# Patient Record
Sex: Female | Born: 1980 | Race: Black or African American | Hispanic: No | Marital: Single | Smoking: Current every day smoker
Health system: Southern US, Community
[De-identification: ages and names within clinical notes are randomized; demographics above are authoritative.]

## PROBLEM LIST (undated history)

## (undated) ENCOUNTER — Emergency Department: Discharge status: Absent without leave | Payer: Self-pay

## (undated) DIAGNOSIS — F419 Anxiety disorder, unspecified: Secondary | ICD-10-CM

## (undated) DIAGNOSIS — E282 Polycystic ovarian syndrome: Secondary | ICD-10-CM

## (undated) DIAGNOSIS — I10 Essential (primary) hypertension: Secondary | ICD-10-CM

## (undated) DIAGNOSIS — K219 Gastro-esophageal reflux disease without esophagitis: Secondary | ICD-10-CM

## (undated) DIAGNOSIS — D649 Anemia, unspecified: Secondary | ICD-10-CM

## (undated) HISTORY — PX: WISDOM TOOTH EXTRACTION: SHX21

---

## 2005-03-13 ENCOUNTER — Emergency Department (HOSPITAL_COMMUNITY): Admission: EM | Admit: 2005-03-13 | Discharge: 2005-03-13 | Payer: Self-pay | Admitting: Emergency Medicine

## 2005-07-14 ENCOUNTER — Emergency Department (HOSPITAL_COMMUNITY): Admission: EM | Admit: 2005-07-14 | Discharge: 2005-07-14 | Payer: Self-pay | Admitting: Emergency Medicine

## 2005-07-20 ENCOUNTER — Emergency Department (HOSPITAL_COMMUNITY): Admission: EM | Admit: 2005-07-20 | Discharge: 2005-07-20 | Payer: Self-pay | Admitting: Emergency Medicine

## 2005-10-06 ENCOUNTER — Emergency Department (HOSPITAL_COMMUNITY): Admission: EM | Admit: 2005-10-06 | Discharge: 2005-10-07 | Payer: Self-pay | Admitting: Emergency Medicine

## 2006-11-05 ENCOUNTER — Emergency Department: Payer: Self-pay | Admitting: Unknown Physician Specialty

## 2007-01-14 ENCOUNTER — Emergency Department: Payer: Self-pay | Admitting: Unknown Physician Specialty

## 2007-02-03 ENCOUNTER — Emergency Department: Payer: Self-pay | Admitting: Internal Medicine

## 2007-06-20 ENCOUNTER — Emergency Department: Payer: Self-pay | Admitting: Emergency Medicine

## 2008-05-10 ENCOUNTER — Emergency Department: Payer: Self-pay | Admitting: Emergency Medicine

## 2008-09-10 ENCOUNTER — Emergency Department: Payer: Self-pay | Admitting: Emergency Medicine

## 2008-09-11 ENCOUNTER — Emergency Department: Payer: Self-pay | Admitting: Emergency Medicine

## 2008-09-13 ENCOUNTER — Emergency Department: Payer: Self-pay | Admitting: Emergency Medicine

## 2009-01-06 ENCOUNTER — Emergency Department: Payer: Self-pay | Admitting: Emergency Medicine

## 2009-01-07 ENCOUNTER — Emergency Department: Payer: Self-pay | Admitting: Unknown Physician Specialty

## 2009-01-10 ENCOUNTER — Ambulatory Visit: Payer: Self-pay | Admitting: Unknown Physician Specialty

## 2009-06-30 ENCOUNTER — Encounter: Payer: Self-pay | Admitting: Maternal and Fetal Medicine

## 2009-07-01 ENCOUNTER — Emergency Department: Payer: Self-pay | Admitting: Emergency Medicine

## 2009-07-20 ENCOUNTER — Encounter: Payer: Self-pay | Admitting: Maternal and Fetal Medicine

## 2009-07-22 ENCOUNTER — Inpatient Hospital Stay: Payer: Self-pay | Admitting: Obstetrics and Gynecology

## 2009-09-14 ENCOUNTER — Emergency Department: Payer: Self-pay | Admitting: Emergency Medicine

## 2009-11-06 ENCOUNTER — Observation Stay: Payer: Self-pay | Admitting: Obstetrics and Gynecology

## 2009-11-06 ENCOUNTER — Emergency Department: Payer: Self-pay | Admitting: Emergency Medicine

## 2009-12-17 ENCOUNTER — Observation Stay: Payer: Self-pay

## 2010-01-01 ENCOUNTER — Observation Stay: Payer: Self-pay | Admitting: Obstetrics and Gynecology

## 2010-01-02 ENCOUNTER — Inpatient Hospital Stay: Payer: Self-pay | Admitting: Obstetrics and Gynecology

## 2010-01-15 ENCOUNTER — Emergency Department: Payer: Self-pay | Admitting: Emergency Medicine

## 2012-04-07 ENCOUNTER — Emergency Department: Payer: Self-pay | Admitting: Emergency Medicine

## 2012-04-07 LAB — HCG, QUANTITATIVE, PREGNANCY: Beta Hcg, Quant.: <1 m[IU]/mL — ABNORMAL LOW

## 2012-04-07 LAB — CBC
HGB: 11.4 g/dL — ABNORMAL LOW (ref 12.0–16.0)
MCH: 22.8 pg — ABNORMAL LOW (ref 26.0–34.0)
MCHC: 31.9 g/dL — ABNORMAL LOW (ref 32.0–36.0)
MCV: 71 fL — ABNORMAL LOW (ref 80–100)
RBC: 4.98 10*6/uL (ref 3.80–5.20)

## 2012-11-17 ENCOUNTER — Emergency Department: Payer: Self-pay | Admitting: Emergency Medicine

## 2013-07-28 ENCOUNTER — Emergency Department: Payer: Self-pay | Admitting: Emergency Medicine

## 2013-07-28 LAB — CBC
HCT: 33.3 % — ABNORMAL LOW (ref 35.0–47.0)
MCHC: 33.3 g/dL (ref 32.0–36.0)
MCV: 71 fL — ABNORMAL LOW (ref 80–100)
Platelet: 267 10*3/uL (ref 150–440)
RBC: 4.72 10*6/uL (ref 3.80–5.20)
WBC: 5.5 10*3/uL (ref 3.6–11.0)

## 2013-07-28 LAB — COMPREHENSIVE METABOLIC PANEL
Anion Gap: 6 — ABNORMAL LOW (ref 7–16)
Bilirubin,Total: 0.4 mg/dL (ref 0.2–1.0)
Calcium, Total: 9 mg/dL (ref 8.5–10.1)
Chloride: 108 mmol/L — ABNORMAL HIGH (ref 98–107)
Co2: 24 mmol/L (ref 21–32)
Creatinine: 0.82 mg/dL (ref 0.60–1.30)
EGFR (African American): >60
Osmolality: 277 (ref 275–301)
Potassium: 3.7 mmol/L (ref 3.5–5.1)
SGPT (ALT): 19 U/L (ref 12–78)

## 2013-07-28 LAB — URINALYSIS, COMPLETE
Bacteria: NONE SEEN
Bilirubin,UR: NEGATIVE
Ketone: NEGATIVE
Leukocyte Esterase: NEGATIVE
Protein: NEGATIVE
RBC,UR: 3 /HPF (ref 0–5)
WBC UR: 2 /HPF (ref 0–5)

## 2013-07-28 LAB — CK TOTAL AND CKMB (NOT AT ARMC)
CK, Total: 158 U/L (ref 21–215)
CK-MB: <0.5 ng/mL — ABNORMAL LOW (ref 0.5–3.6)

## 2013-12-28 ENCOUNTER — Emergency Department: Payer: Self-pay | Admitting: Emergency Medicine

## 2013-12-28 LAB — WET PREP, GENITAL

## 2013-12-28 LAB — CBC
HCT: 35.6 % (ref 35.0–47.0)
HGB: 11.5 g/dL — AB (ref 12.0–16.0)
MCH: 23.1 pg — AB (ref 26.0–34.0)
MCHC: 32.3 g/dL (ref 32.0–36.0)
MCV: 72 fL — AB (ref 80–100)
Platelet: 241 10*3/uL (ref 150–440)
RBC: 4.96 10*6/uL (ref 3.80–5.20)
RDW: 16.1 % — ABNORMAL HIGH (ref 11.5–14.5)
WBC: 6.1 10*3/uL (ref 3.6–11.0)

## 2013-12-28 LAB — URINALYSIS, COMPLETE
BACTERIA: NEGATIVE
Bilirubin,UR: NEGATIVE
Blood: NEGATIVE
GLUCOSE, UR: NEGATIVE mg/dL (ref 0–75)
Leukocyte Esterase: NEGATIVE
Nitrite: NEGATIVE
PROTEIN: NEGATIVE
Ph: 5 (ref 4.5–8.0)
RBC, UR: NONE SEEN /HPF (ref 0–5)
SPECIFIC GRAVITY: 1.028 (ref 1.003–1.030)
WBC UR: NONE SEEN /HPF (ref 0–5)

## 2013-12-28 LAB — HCG, QUANTITATIVE, PREGNANCY: BETA HCG, QUANT.: 16155 m[IU]/mL — AB

## 2013-12-28 LAB — GC/CHLAMYDIA PROBE AMP

## 2014-11-17 ENCOUNTER — Emergency Department: Payer: Self-pay | Admitting: Emergency Medicine

## 2014-12-24 ENCOUNTER — Emergency Department: Payer: Self-pay | Admitting: Emergency Medicine

## 2014-12-24 LAB — URINALYSIS, COMPLETE
BACTERIA: NONE SEEN
Bilirubin,UR: NEGATIVE
Blood: NEGATIVE
Glucose,UR: NEGATIVE mg/dL (ref 0–75)
Hyaline Cast: 1
KETONE: NEGATIVE
Nitrite: NEGATIVE
Ph: 5 (ref 4.5–8.0)
Protein: NEGATIVE
RBC,UR: 3 /HPF (ref 0–5)
SPECIFIC GRAVITY: 1.028 (ref 1.003–1.030)
Squamous Epithelial: 32

## 2014-12-26 LAB — URINE CULTURE

## 2015-05-16 ENCOUNTER — Emergency Department (HOSPITAL_BASED_OUTPATIENT_CLINIC_OR_DEPARTMENT_OTHER)
Admission: EM | Admit: 2015-05-16 | Discharge: 2015-05-16 | Disposition: A | Discharge status: Home / Self Care | Payer: Medicaid Other | Attending: Emergency Medicine | Admitting: Emergency Medicine

## 2015-05-16 ENCOUNTER — Encounter (HOSPITAL_BASED_OUTPATIENT_CLINIC_OR_DEPARTMENT_OTHER): Payer: Self-pay | Admitting: *Deleted

## 2015-05-16 DIAGNOSIS — Z720 Tobacco use: Secondary | ICD-10-CM | POA: Diagnosis not present

## 2015-05-16 DIAGNOSIS — Z3202 Encounter for pregnancy test, result negative: Secondary | ICD-10-CM | POA: Insufficient documentation

## 2015-05-16 DIAGNOSIS — M546 Pain in thoracic spine: Secondary | ICD-10-CM | POA: Insufficient documentation

## 2015-05-16 DIAGNOSIS — Z87442 Personal history of urinary calculi: Secondary | ICD-10-CM | POA: Insufficient documentation

## 2015-05-16 LAB — URINALYSIS, ROUTINE W REFLEX MICROSCOPIC
BILIRUBIN URINE: NEGATIVE
Glucose, UA: NEGATIVE mg/dL
HGB URINE DIPSTICK: NEGATIVE
Ketones, ur: NEGATIVE mg/dL
Leukocytes, UA: NEGATIVE
Nitrite: NEGATIVE
PH: 5.5 (ref 5.0–8.0)
Protein, ur: NEGATIVE mg/dL
SPECIFIC GRAVITY, URINE: 1.024 (ref 1.005–1.030)
Urobilinogen, UA: 0.2 mg/dL (ref 0.0–1.0)

## 2015-05-16 LAB — PREGNANCY, URINE: Preg Test, Ur: NEGATIVE

## 2015-05-16 MED ORDER — METHOCARBAMOL 500 MG PO TABS: 500.0000 mg | ORAL_TABLET | Freq: Two times a day (BID) | ORAL | Status: DC | PRN

## 2015-05-16 MED ORDER — NAPROXEN 250 MG PO TABS: 250.0000 mg | ORAL_TABLET | Freq: Two times a day (BID) | ORAL | Status: DC

## 2015-05-16 MED ORDER — ACETAMINOPHEN 325 MG PO TABS: 650.0000 mg | ORAL_TABLET | Freq: Once | ORAL | Status: DC

## 2015-05-16 NOTE — ED Notes (Signed)
Pt amb to room 6 with slow, steady gait in nad. 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 Pain is to her mid upper back area.

## 2015-05-16 NOTE — ED Provider Notes (Signed)
CSN: [id number]     Arrival date & time 05/16/15  1017 History   First MD Initiated Contact with Patient 05/16/15 1042     Chief Complaint  Patient presents with  . Back Pain   Deborah FlakeLatoya Bradford is a 34 y.o. female who is otherwise healthy who presents to emergency department complaining of right-sided midthoracic back pain ongoing for the past 3 days after she lifted a patient at work. Patient is complaining of 8 out of 10 sharp pain to her rhomboid area that is worse with movement. Patient does report she has a history of kidney stones but reports this feels different and is in a different spot. She is taking nothing for treatment today. The patient denies fevers, chills, chest pain, shortness of breath, cough, wheezing, loss of bladder control, loss of bowel control, urinary symptoms, hematuria, abdominal pain, nausea, vomiting, diarrhea, history of cancer, history of IV drug use, changes, numbness, tingling or weakness.  (Consider location/radiation/quality/duration/timing/severity/associated sxs/prior Treatment) HPI  History reviewed. No pertinent past medical history. History reviewed. No pertinent past surgical history. History reviewed. No pertinent family history. History  Substance Use Topics  . Smoking status: Current Every Day Smoker  . Smokeless tobacco: Not on file  . Alcohol Use: Not on file   OB History    No data available     Review of Systems  Constitutional: Negative for fever and chills.  HENT: Negative for congestion and sore throat.   Eyes: Negative for visual disturbance.  Respiratory: Negative for cough, shortness of breath and wheezing.   Cardiovascular: Negative for chest pain and palpitations.  Gastrointestinal: Negative for nausea, vomiting, abdominal pain and diarrhea.  Genitourinary: Negative for dysuria, urgency, frequency, hematuria and difficulty urinating.  Musculoskeletal: Positive for back pain. Negative for gait problem, neck pain and neck stiffness.   Skin: Negative for rash.  Neurological: Negative for syncope, weakness, light-headedness, numbness and headaches.      Allergies  Flagyl  Home Medications   Prior to Admission medications   Medication Sig Start Date End Date Taking? Authorizing Provider  methocarbamol (ROBAXIN) 500 MG tablet Take 1 tablet (500 mg total) by mouth 2 (two) times daily as needed for muscle spasms. 05/16/15   Everlene FarrierWilliam Tommey Barret, PA-C  naproxen (NAPROSYN) 250 MG tablet Take 1 tablet (250 mg total) by mouth 2 (two) times daily with a meal. 05/16/15   Everlene FarrierWilliam Jamai Dolce, PA-C   BP 113/72 mmHg  Pulse 92  Temp(Src) 98.5 F (36.9 C) (Oral)  Resp 18  Ht 5\' 3"  (1.6 m)  Wt 150 lb (68.04 kg)  BMI 26.58 kg/m2  SpO2 99%  LMP 04/24/2015 Physical Exam  Constitutional: She appears well-developed and well-nourished. No distress.  Nontoxic appearing.  HENT:  Head: Normocephalic and atraumatic.  Eyes: Conjunctivae are normal. Pupils are equal, round, and reactive to light. Right eye exhibits no discharge. Left eye exhibits no discharge.  Neck: Neck supple.  Cardiovascular: Normal rate, regular rhythm, normal heart sounds and intact distal pulses.   Pulmonary/Chest: Effort normal and breath sounds normal. No respiratory distress. She has no wheezes. She has no rales.  Abdominal: Soft. Bowel sounds are normal. She exhibits no distension. There is no tenderness. There is no guarding.  Musculoskeletal: Normal range of motion. She exhibits tenderness. She exhibits no edema.  Patient has tenderness to her right mid back over her rhomboid muscles. No midline back tenderness. No neck tenderness. No back edema, deformity, erythema or ecchymosis. Patient has 5 out of 5 strength in her  bilateral lower extremities. The patient is able to ambulate without difficulty or assistance. No lower extremity edema or tenderness.  Lymphadenopathy:    She has no cervical adenopathy.  Neurological: She is alert. She has normal reflexes. She  displays normal reflexes. Coordination normal.  Bilateral patellar DTRs are intact. Patient has good sensation in her bilateral lower extremities.  Skin: Skin is warm and dry. No rash noted. She is not diaphoretic.  Psychiatric: She has a normal mood and affect. Her behavior is normal.  Nursing note and vitals reviewed.   ED Course  Procedures (including critical care time) Labs Review Labs Reviewed  URINALYSIS, ROUTINE W REFLEX MICROSCOPIC (NOT AT [facility]) - Abnormal; Notable for the following:    APPearance CLOUDY (*)    All other components within normal limits  PREGNANCY, URINE    Imaging Review No results found.   EKG Interpretation None      Filed Vitals:   05/16/15 1024  BP: 113/72  Pulse: 92  Temp: 98.5 F (36.9 C)  TempSrc: Oral  Resp: 18  Height:  (1.6 m)  Weight: 150 lb (68.04 kg)  SpO2: 99%     MDM   Meds given in ED:  Medications  acetaminophen (TYLENOL) tablet 650 mg (not administered)    New Prescriptions   METHOCARBAMOL (ROBAXIN) 500 MG TABLET    Take 1 tablet (500 mg total) by mouth 2 (two) times daily as needed for muscle spasms.   NAPROXEN (NAPROSYN) 250 MG TABLET    Take 1 tablet (250 mg total) by mouth 2 (two) times daily with a meal.    Final diagnoses:  Right-sided thoracic back pain   Patient with right thoracic back pain over her rhomboid muscles after lifting a patient at work.  No neurological deficits and normal neuro exam.  Patient can walk without difficulty or assistance.  No loss of bowel or bladder control.  No concern for cauda equina.  No fever, night sweats, weight loss, h/o cancer, IVDU.  RICE protocol and pain medicine indicated and discussed with patient. We'll discharge with prescription for naproxen and Robaxin. I advised caution using Robaxin as it can make her drowsy. I advised the patient to follow-up with their primary care provider this week. I advised the patient to return to the emergency department with new or  worsening symptoms or new concerns. The patient verbalized understanding and agreement with plan.      Everlene Farrier, PA-C 05/16/15 1144  Doug Sou, MD 05/16/15 [phone]

## 2015-05-16 NOTE — Discharge Instructions (Signed)
Back Pain, Adult °Low back pain is very common. About 1 in 5 people have back pain. The cause of low back pain is rarely dangerous. The pain often gets better over time. About half of people with a sudden onset of back pain feel better in just 2 weeks. About 8 in 10 people feel better by 6 weeks.  °CAUSES °Some common causes of back pain include: °· Strain of the muscles or ligaments supporting the spine. °· Wear and tear (degeneration) of the spinal discs. °· Arthritis. °· Direct injury to the back. °DIAGNOSIS °Most of the time, the direct cause of low back pain is not known. However, back pain can be treated effectively even when the exact cause of the pain is unknown. Answering your caregiver's questions about your overall health and symptoms is one of the most accurate ways to make sure the cause of your pain is not dangerous. If your caregiver needs more information, he or she may order lab work or imaging tests (X-rays or MRIs). However, even if imaging tests show changes in your back, this usually does not require surgery. °HOME CARE INSTRUCTIONS °For many people, back pain returns. Since low back pain is rarely dangerous, it is often a condition that people can learn to manage on their own.  °· Remain active. It is stressful on the back to sit or stand in one place. Do not sit, drive, or stand in one place for more than 30 minutes at a time. Take short walks on level surfaces as soon as pain allows. Try to increase the length of time you walk each day. °· Do not stay in bed. Resting more than 1 or 2 days can delay your recovery. °· Do not avoid exercise or work. Your body is made to move. It is not dangerous to be active, even though your back may hurt. Your back will likely heal faster if you return to being active before your pain is gone. °· Pay attention to your body when you  bend and lift. Many people have less discomfort when lifting if they bend their knees, keep the load close to their bodies, and  avoid twisting. Often, the most comfortable positions are those that put less stress on your recovering back. °· Find a comfortable position to sleep. Use a firm mattress and lie on your side with your knees slightly bent. If you lie on your back, put a pillow under your knees. °· Only take over-the-counter or prescription medicines as directed by your caregiver. Over-the-counter medicines to reduce pain and inflammation are often the most helpful. Your caregiver may prescribe muscle relaxant drugs. These medicines help dull your pain so you can more quickly return to your normal activities and healthy exercise. °· Put ice on the injured area. °¨ Put ice in a plastic bag. °¨ Place a towel between your skin and the bag. °¨ Leave the ice on for 15-20 minutes, 03-04 times a day for the first 2 to 3 days. After that, ice and heat may be alternated to reduce pain and spasms. °· Ask your caregiver about trying back exercises and gentle massage. This may be of some benefit. °· Avoid feeling anxious or stressed. Stress increases muscle tension and can worsen back pain. It is important to recognize when you are anxious or stressed and learn ways to manage it. Exercise is a great option. °SEEK MEDICAL CARE IF: °· You have pain that is not relieved with rest or medicine. °· You have pain that does not improve in 1 week. °· You have new symptoms. °· You are generally not feeling well. °SEEK   IMMEDIATE MEDICAL CARE IF:  °· You have pain that radiates from your back into your legs. °· You develop new bowel or bladder control problems. °· You have unusual weakness or numbness in your arms or legs. °· You develop nausea or vomiting. °· You develop abdominal pain. °· You feel faint. °Document Released: 11/05/2005 Document Revised: 05/06/2012 Document Reviewed: 03/09/2014 °[employer]® Patient Information ©2015 [employer]. This information is not intended to replace advice given to you by your health care provider. Make sure you  discuss any questions you have with your health care provider. ° °Back Exercises °Back exercises help treat and prevent back injuries. The goal of back exercises is to increase the strength of your abdominal and back muscles and the flexibility of your back. These exercises should be started when you no longer have back pain. Back exercises include: °· Pelvic Tilt. Lie on your back with your knees bent. Tilt your pelvis until the lower part of your back is against the floor. Hold this position 5 to 10 sec and repeat 5 to 10 times. °· Knee to Chest. Pull first 1 knee up against your chest and hold for 20 to 30 seconds, repeat this with the other knee, and then both knees. This may be done with the other leg straight or bent, whichever feels better. °· Sit-Ups or Curl-Ups. Bend your knees 90 degrees. Start with tilting your pelvis, and do a partial, slow sit-up, lifting your trunk only 30 to 45 degrees off the floor. Take at least 2 to 3 seconds for each sit-up. Do not do sit-ups with your knees out straight. If partial sit-ups are difficult, simply do the above but with only tightening your abdominal muscles and holding it as directed. °· Hip-Lift. Lie on your back with your knees flexed 90 degrees. Push down with your feet and shoulders as you raise your hips a couple inches off the floor; hold for 10 seconds, repeat 5 to 10 times. °· Back arches. Lie on your stomach, propping yourself up on bent elbows. Slowly press on your hands, causing an arch in your low back. Repeat 3 to 5 times. Any initial stiffness and discomfort should lessen with repetition over time. °· Shoulder-Lifts. Lie face down with arms beside your body. Keep hips and torso pressed to floor as you slowly lift your head and shoulders off the floor. °Do not overdo your exercises, especially in the beginning. Exercises may cause you some mild back discomfort which lasts for a few minutes; however, if the pain is more severe, or lasts for more than 15  minutes, do not continue exercises until you see your caregiver. Improvement with exercise therapy for back problems is slow.  °See your caregivers for assistance with developing a proper back exercise program. °Document Released: 12/13/2004 Document Revised: 01/28/2012 Document Reviewed: 09/06/2011 °[employer]® Patient Information ©2015 [employer]. This information is not intended to replace advice given to you by your health care provider. Make sure you discuss any questions you have with your health care provider. ° °

## 2015-07-06 ENCOUNTER — Encounter: Payer: Self-pay | Admitting: Emergency Medicine

## 2015-07-06 ENCOUNTER — Emergency Department
Admission: EM | Admit: 2015-07-06 | Discharge: 2015-07-06 | Disposition: A | Discharge status: Home / Self Care | Payer: Medicaid Other | Attending: Emergency Medicine | Admitting: Emergency Medicine

## 2015-07-06 DIAGNOSIS — K0889 Other specified disorders of teeth and supporting structures: Secondary | ICD-10-CM

## 2015-07-06 DIAGNOSIS — K088 Other specified disorders of teeth and supporting structures: Secondary | ICD-10-CM | POA: Diagnosis not present

## 2015-07-06 DIAGNOSIS — Z720 Tobacco use: Secondary | ICD-10-CM | POA: Diagnosis not present

## 2015-07-06 DIAGNOSIS — K0381 Cracked tooth: Secondary | ICD-10-CM | POA: Diagnosis not present

## 2015-07-06 MED ORDER — IBUPROFEN 800 MG PO TABS: 800.0000 mg | ORAL_TABLET | Freq: Three times a day (TID) | ORAL | Status: DC | PRN

## 2015-07-06 MED ORDER — PENICILLIN V POTASSIUM 500 MG PO TABS: 500.0000 mg | ORAL_TABLET | Freq: Four times a day (QID) | ORAL | Status: DC

## 2015-07-06 NOTE — ED Notes (Signed)
Pt to ed with c/o sore throat, dental pain, congestion, runny nose, cough since Monday.

## 2015-07-06 NOTE — ED Provider Notes (Signed)
[facility] Emergency Department Provider Note  ____________________________________________  Time seen: Approximately 10:23 AM  I have reviewed the triage vital signs and the nursing notes.   HISTORY  Chief Complaint Sore Throat   HPI Deborah Bradford is a 34 y.o. female presents to the ER for complaints of 3-4 days of left upper tooth pain. Patient reports she has a broken wisdom tooth with some overlapping it that intermittently causes her problems but states that the last few days the pain has increased. Denies fever. Reports continues to eat and drink well.  Patient does report that she had a mild cough on Sunday and Monday and was seen at [facility] and diagnosed with bronchitis and given a Z-Pak. Patient reports she is not taking the Z-Pak as the cough has resolved. Denies cough at this time. Denies congestion or sore throat. Reports here for tooth pain.  States pain is currently 6 out of 10 to left upper tooth. Denies other pain.   History reviewed. No pertinent past medical history.  There are no active problems to display for this patient.   History reviewed. No pertinent past surgical history.  Current Outpatient Rx  Name  Route  Sig  Dispense  Refill                          Allergies Flagyl  No family history on file.  Social History Social History  Substance Use Topics  . Smoking status: Current Every Day Smoker  . Smokeless tobacco: None  . Alcohol Use: Yes    Review of Systems Constitutional: No fever/chills Eyes: No visual changes. ENT: No sore throat. Positive for dental pain. Cardiovascular: Denies chest pain. Respiratory: Denies shortness of breath. Gastrointestinal: No abdominal pain.  No nausea, no vomiting.  No diarrhea.  No constipation. Genitourinary: Negative for dysuria. Musculoskeletal: Negative for back pain. Skin: Negative for rash. Neurological: Negative for headaches, focal weakness or  numbness.  10-point ROS otherwise negative.  ____________________________________________   PHYSICAL EXAM:  VITAL SIGNS: ED Triage Vitals  Enc Vitals Group     BP 07/06/15 0950 120/89 mmHg     Pulse Rate 07/06/15 0950 86     Resp 07/06/15 0950 20     Temp 07/06/15 0950 98.6 F (37 C)     Temp Source 07/06/15 0950 Oral     SpO2 07/06/15 0950 99 %     Weight 07/06/15 0950 225 lb (102.059 kg)     Height 07/06/15 0950  (1.575 m)     Head Cir --      Peak Flow --      Pain Score 07/06/15 0936 7     Pain Loc --      Pain Edu? --      Excl. in GC? --     Constitutional: Alert and oriented. Well appearing and in no acute distress. Eyes: Conjunctivae are normal. PERRL. EOMI. Head: Atraumatic.  Ears: no erythema, normal TMs bilaterally.   Nose: No congestion/rhinnorhea.  Mouth/Throat: Mucous membranes are moist.  Oropharynx non-erythematous. LEft upper tooth #16 fractured with gum tissue overlying tooth, mild gum swelling, no palpable or visualized abscess. NO facial swelling.  Neck: No stridor.  No cervical spine tenderness to palpation. Hematological/Lymphatic/Immunilogical: No cervical lymphadenopathy. Cardiovascular: Normal rate, regular rhythm. Grossly normal heart sounds.  Good peripheral circulation. Respiratory: Normal respiratory effort.  No retractions. Lungs CTAB. Gastrointestinal: Soft and nontender. No distention. Normal Bowel sounds.   Musculoskeletal:  No lower or upper extremity tenderness nor edema.  No joint effusions. Bilateral pedal pulses equal and easily palpated.  Neurologic:  Normal speech and language. No gross focal neurologic deficits are appreciated. No gait instability. Skin:  Skin is warm, dry and intact. No rash noted. Psychiatric: Mood and affect are normal. Speech and behavior are normal.  ____________________________________________   LABS (all labs ordered are listed, but only abnormal results are displayed)  Labs Reviewed - No data to  display ____________________________________________   INITIAL IMPRESSION / ASSESSMENT AND PLAN / ED COURSE  Pertinent labs & imaging results that were available during my care of the patient were reviewed by me and considered in my medical decision making (see chart for details).  No acute distress. Very well-appearing patient. Presents the ER for treatment left outer upper dental pain. 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 No palpable or visualized abscess. We'll start patient on Penicillin VK and ibuprofen. Patient follow-up with dentist. Dental clinic information given. Patient verbalized understanding and agreed to plan. Discussed return parameters. ___________________________________________   FINAL CLINICAL IMPRESSION(S) / ED DIAGNOSES  Final diagnoses:  Pain, dental       Renford Dills, NP 07/06/15 1030  Myrna Blazer, MD 07/06/15 2246

## 2015-07-06 NOTE — Discharge Instructions (Signed)
Take medication as prescribed. Drink plenty of water. Eat soft food diet.   Follow-up with dentist as soon as possible. See below for local dental options as needed. Return to the ER for new or worsening concerns.  Dental Pain A tooth ache may be caused by cavities (tooth decay). Cavities expose the nerve of the tooth to air and hot or cold temperatures. It may come from an infection or abscess (also called a boil or furuncle) around your tooth. It is also often caused by dental caries (tooth decay). This causes the pain you are having. DIAGNOSIS  Your caregiver can diagnose this problem by exam. TREATMENT   If caused by an infection, it may be treated with medications which kill germs (antibiotics) and pain medications as prescribed by your caregiver. Take medications as directed.  Only take over-the-counter or prescription medicines for pain, discomfort, or fever as directed by your caregiver.  Whether the tooth ache today is caused by infection or dental disease, you should see your dentist as soon as possible for further care. SEEK MEDICAL CARE IF: The exam and treatment you received today has been provided on an emergency basis only. This is not a substitute for complete medical or dental care. If your problem worsens or new problems (symptoms) appear, and you are unable to meet with your dentist, call or return to this location. SEEK IMMEDIATE MEDICAL CARE IF:   You have a fever.  You develop redness and swelling of your face, jaw, or neck.  You are unable to open your mouth.  You have severe pain uncontrolled by pain medicine. MAKE SURE YOU:   Understand these instructions.  Will watch your condition.  Will get help right away if you are not doing well or get worse. Document Released: 11/05/2005 Document Revised: 01/28/2012 Document Reviewed: 06/23/2008 [facility] Patient Information 2015 [place], [place]. This information is not intended to replace advice given to you by your  health care provider. Make sure you discuss any questions you have with your health care provider.  OPTIONS FOR DENTAL FOLLOW UP CARE  [place] [facility] - Local Safety Net Dental Clinics [email or web address]   [facility] [phone])  Sharl Ma [phone])  [place] [phone])  [facility] [phone])  [facility] [phone]) This clinic caters to the indigent population and is on a lottery system. Location: [employer] [facility], [employer], [address], [place] Clinic Hours: Wednesdays from 6pm - 9pm, patients seen by a lottery system. For dates, call or go to [email or web address] Services: Cleanings, fillings and simple extractions. Payment Options: DENTAL WORK IS FREE OF CHARGE. Bring proof of income or support. Best way to get seen: Arrive at 5:15 pm - this is a lottery, NOT first come/first serve, so arriving earlier will not increase your chances of being seen.     [facility] [phone] Select option 1 for emergencies   Location: [facility], [place], [address] [place] Clinic Hours: No walk-ins accepted - call the day before to schedule an appointment. Check in times are 9:30 am and 1:30 pm. Services: Simple extractions, temporary fillings, pulpectomy/pulp debridement, uncomplicated abscess drainage. Payment Options: PAYMENT IS DUE AT THE TIME OF SERVICE.  Fee is usually $100-200, additional surgical procedures (e.g. abscess drainage) may be extra. Cash, checks, Visa/MasterCard accepted.  Can file Medicaid if patient is covered for dental - patient should call case worker  to check. No discount for [facility] patients. Best way to get seen: MUST call the day before and get onto the schedule. Can usually be seen  the next 1-2 days. No walk-ins accepted.     [facility] [phone]   Location: [facility], [address] [place] Clinic Hours: M, W, Th, F 8am or 1:30pm, Tues 9a or 1:30 - first come/first served. Services: Simple extractions, temporary fillings, uncomplicated abscess drainage.  You do not need to be an [facility] resident. Payment Options: PAYMENT IS DUE AT THE TIME OF SERVICE. Dental insurance, otherwise sliding scale - bring proof of income or support. Depending on income and treatment needed, cost is usually $50-200. Best way to get seen: Arrive early as it is first come/first served.     [facility] [phone]   Location: [address] Clinic Hours: Mon-Thu 8a-5p Services: Most basic dental services including extractions and fillings. Payment Options: PAYMENT IS DUE AT THE TIME OF SERVICE. Sliding scale, up to 50% off - bring proof if income or support. Medicaid with dental option accepted. Best way to get seen: Call to schedule an appointment, can usually be seen within 2 weeks OR they will try to see walk-ins - show up at 8a or 2p (you may have to wait).     [facility] [phone] [place] RESIDENTS ONLY   Location: [facility], [address], [place], [place] [address] Clinic Hours: By appointment only. Monday - Thursday 8am-5pm, Friday 8am-12pm Services: Cleanings, fillings, extractions. Payment Options: PAYMENT IS DUE AT THE TIME OF SERVICE. Cash, Visa or MasterCard. Sliding scale - $30 minimum per service. Best way to get seen: Come in to office, complete packet and make an appointment - need proof of income or support monies for each household member and proof of [facility] residence. Usually takes about a month to get in.     [facility] [phone]   Location: [address]  [facility] Hours: Walk-in Urgent Care Dental Services are offered Monday-Friday mornings only. The numbers of emergencies accepted daily is limited to the number of providers available. Maximum 15 - Mondays, Wednesdays & Thursdays Maximum 10 - Tuesdays & Fridays Services: You do not need to be a [facility] resident to be seen for a dental emergency. Emergencies are defined as pain, swelling, abnormal bleeding, or dental trauma. Walkins will receive x-rays if needed. NOTE: Dental cleaning is not an emergency. Payment Options: PAYMENT IS DUE AT THE TIME OF SERVICE. Minimum co-pay is $40.00 for uninsured patients. Minimum co-pay is $3.00 for Medicaid with dental coverage. Dental Insurance is accepted and must be presented at time of visit. Medicare does not cover dental. Forms of payment: Cash, credit card, checks. Best way to get seen: If not previously registered with the clinic, walk-in dental registration begins at 7:15 am and is on a first come/first serve basis. If previously registered with the clinic, call to make an appointment.     [facility] [phone] [place] RESIDENTS ONLY   Location: [address] [place], [place] Clinic Hours: Mon-Thu 10a-2p Services: Extractions only! Payment Options: FREE (donations accepted) - bring proof of income or support Best way to get seen: Call and schedule an appointment OR come at 8am on the 1st Monday of every month (except for holidays) when it is first come/first served.     [employer] [phone]  Location: [place] Clinic Hours: Friday mornings Services, Payment Options, Best way to get seen: Call for info

## 2016-03-14 ENCOUNTER — Emergency Department
Admission: EM | Admit: 2016-03-14 | Discharge: 2016-03-14 | Disposition: A | Discharge status: Home / Self Care | Payer: Medicaid Other | Attending: Emergency Medicine | Admitting: Emergency Medicine

## 2016-03-14 ENCOUNTER — Emergency Department: Payer: Medicaid Other

## 2016-03-14 ENCOUNTER — Encounter: Payer: Self-pay | Admitting: *Deleted

## 2016-03-14 DIAGNOSIS — F419 Anxiety disorder, unspecified: Secondary | ICD-10-CM | POA: Insufficient documentation

## 2016-03-14 DIAGNOSIS — F17200 Nicotine dependence, unspecified, uncomplicated: Secondary | ICD-10-CM | POA: Diagnosis not present

## 2016-03-14 DIAGNOSIS — R600 Localized edema: Secondary | ICD-10-CM | POA: Diagnosis not present

## 2016-03-14 DIAGNOSIS — R601 Generalized edema: Secondary | ICD-10-CM

## 2016-03-14 DIAGNOSIS — M25471 Effusion, right ankle: Secondary | ICD-10-CM | POA: Diagnosis present

## 2016-03-14 HISTORY — DX: Anxiety disorder, unspecified: F41.9

## 2016-03-14 LAB — CBC WITH DIFFERENTIAL/PLATELET
BASOS PCT: 0 %
Basophils Absolute: 0 10*3/uL (ref 0–0.1)
EOS ABS: 0.5 10*3/uL (ref 0–0.7)
Eosinophils Relative: 7 %
HCT: 34.3 % — ABNORMAL LOW (ref 35.0–47.0)
Hemoglobin: 10.8 g/dL — ABNORMAL LOW (ref 12.0–16.0)
Lymphocytes Relative: 35 %
Lymphs Abs: 2.6 10*3/uL (ref 1.0–3.6)
MCH: 22 pg — ABNORMAL LOW (ref 26.0–34.0)
MCHC: 31.5 g/dL — ABNORMAL LOW (ref 32.0–36.0)
MCV: 69.8 fL — ABNORMAL LOW (ref 80.0–100.0)
MONO ABS: 0.7 10*3/uL (ref 0.2–0.9)
MONOS PCT: 9 %
Neutro Abs: 3.6 10*3/uL (ref 1.4–6.5)
Neutrophils Relative %: 49 %
Platelets: 274 10*3/uL (ref 150–440)
RBC: 4.92 MIL/uL (ref 3.80–5.20)
RDW: 15.5 % — AB (ref 11.5–14.5)
WBC: 7.5 10*3/uL (ref 3.6–11.0)

## 2016-03-14 LAB — POCT PREGNANCY, URINE: PREG TEST UR: NEGATIVE

## 2016-03-14 LAB — BASIC METABOLIC PANEL
Anion gap: 6 (ref 5–15)
BUN: 14 mg/dL (ref 6–20)
CALCIUM: 8.9 mg/dL (ref 8.9–10.3)
CO2: 24 mmol/L (ref 22–32)
Chloride: 108 mmol/L (ref 101–111)
Creatinine, Ser: 0.77 mg/dL (ref 0.44–1.00)
GFR calc non Af Amer: >60 mL/min (ref >60)
Glucose, Bld: 98 mg/dL (ref 65–99)
Potassium: 3.5 mmol/L (ref 3.5–5.1)
SODIUM: 138 mmol/L (ref 135–145)

## 2016-03-14 NOTE — Discharge Instructions (Signed)
Edema °Edema is an abnormal buildup of fluids in your body tissues. Edema is somewhat dependent on gravity to pull the fluid to the lowest place in your body. That makes the condition more common in the legs and thighs (lower extremities). Painless swelling of the feet and ankles is common and becomes more likely as you get older. It is also common in looser tissues, like around your eyes.  °When the affected area is squeezed, the fluid may move out of that spot and leave a dent for a few moments. This dent is called pitting.  °CAUSES  °There are many possible causes of edema. Eating too much salt and being on your feet or sitting for a long time can cause edema in your legs and ankles. Hot weather may make edema worse. Common medical causes of edema include: °· Heart failure. °· Liver disease. °· Kidney disease. °· Weak blood vessels in your legs. °· Cancer. °· An injury. °· Pregnancy. °· Some medications. °· Obesity.  °SYMPTOMS  °Edema is usually painless. Your skin may look swollen or shiny.  °DIAGNOSIS  °Your health care provider may be able to diagnose edema by asking about your medical history and doing a physical exam. You may need to have tests such as X-rays, an electrocardiogram, or blood tests to check for medical conditions that may cause edema.  °TREATMENT  °Edema treatment depends on the cause. If you have heart, liver, or kidney disease, you need the treatment appropriate for these conditions. General treatment may include: °· Elevation of the affected body part above the level of your heart. °· Compression of the affected body part. Pressure from elastic bandages or support stockings squeezes the tissues and forces fluid back into the blood vessels. This keeps fluid from entering the tissues. °· Restriction of fluid and salt intake. °· Use of a water pill (diuretic). These medications are appropriate only for some types of edema. They pull fluid out of your body and make you urinate more often. This  gets rid of fluid and reduces swelling, but diuretics can have side effects. Only use diuretics as directed by your health care provider. °HOME CARE INSTRUCTIONS  °· Keep the affected body part above the level of your heart when you are lying down.   °· Do not sit still or stand for prolonged periods.   °· Do not put anything directly under your knees when lying down. °· Do not wear constricting clothing or garters on your upper legs.   °· Exercise your legs to work the fluid back into your blood vessels. This may help the swelling go down.   °· Wear elastic bandages or support stockings to reduce ankle swelling as directed by your health care provider.   °· Eat a low-salt diet to reduce fluid if your health care provider recommends it.   °· Only take medicines as directed by your health care provider.  °SEEK MEDICAL CARE IF:  °· Your edema is not responding to treatment. °· You have heart, liver, or kidney disease and notice symptoms of edema. °· You have edema in your legs that does not improve after elevating them.   °· You have sudden and unexplained weight gain. °SEEK IMMEDIATE MEDICAL CARE IF:  °· You develop shortness of breath or chest pain.   °· You cannot breathe when you lie down. °· You develop pain, redness, or warmth in the swollen areas.   °· You have heart, liver, or kidney disease and suddenly get edema. °· You have a fever and your symptoms suddenly get worse. °MAKE SURE YOU:  °·   Understand these instructions. °· Will watch your condition. °· Will get help right away if you are not doing well or get worse. °  °This information is not intended to replace advice given to you by your health care provider. Make sure you discuss any questions you have with your health care provider. °  °Document Released: 11/05/2005 Document Revised: 11/26/2014 Document Reviewed: 08/28/2013 °Elsevier Interactive Patient Education ©2016 [employer] ° °

## 2016-03-14 NOTE — ED Provider Notes (Signed)
[facility] Emergency Department Provider Note        Time seen: ----------------------------------------- 6:42 PM on 03/14/2016 -----------------------------------------    I have reviewed the triage vital signs and the nursing notes.   HISTORY  Chief Complaint Leg Swelling    HPI Deborah Bradford is a 35 y.o. female who presents ER for right ankle swelling and pain behind her right calf. Patient reports she does have anxiety, did take a Valium today. She denies fevers chills or other complaints. She states she sits a lot at work.   Past Medical History  Diagnosis Date  . Anxiety     There are no active problems to display for this patient.   History reviewed. No pertinent past surgical history.  Allergies Flagyl  Social History Social History  Substance Use Topics  . Smoking status: Current Every Day Smoker  . Smokeless tobacco: None  . Alcohol Use: Yes    Review of Systems Constitutional: Negative for fever. Eyes: Negative for visual changes. ENT: Negative for sore throat. Cardiovascular: Negative for chest pain. Respiratory: Negative for shortness of breath. Gastrointestinal: Negative for abdominal pain, vomiting and diarrhea. Genitourinary: Negative for dysuria. Musculoskeletal: Positive for right leg swelling Skin: Negative for rash. Neurological: Negative for headaches, focal weakness or numbness.  10-point ROS otherwise negative.  ____________________________________________   PHYSICAL EXAM:  VITAL SIGNS: ED Triage Vitals  Enc Vitals Group     BP 03/14/16 1828 114/80 mmHg     Pulse Rate 03/14/16 1828 96     Resp 03/14/16 1828 20     Temp 03/14/16 1828 98.6 F (37 C)     Temp Source 03/14/16 1828 Oral     SpO2 03/14/16 1828 96 %     Weight 03/14/16 1828 240 lb (108.863 kg)     Height 03/14/16 1828  (1.6 m)     Head Cir --      Peak Flow --      Pain Score 03/14/16 1828 2     Pain Loc --      Pain Edu? --       Excl. in GC? --     Constitutional: Alert and oriented. Well appearing and in no distress. ENT   Head: Normocephalic and atraumatic.   Nose: No congestion/rhinnorhea.   Mouth/Throat: Mucous membranes are moist.   Neck: No stridor. Cardiovascular: Normal rate, regular rhythm. No murmurs, rubs, or gallops. Respiratory: Normal respiratory effort without tachypnea nor retractions. Breath sounds are clear and equal bilaterally. No wheezes/rales/rhonchi. Musculoskeletal: Mild edema is noted to the right lower extremity, normal pulses Neurologic:  Normal speech and language. No gross focal neurologic deficits are appreciated.  Skin:  Skin is warm, dry and intact. No rash noted. Psychiatric: Mood and affect are normal. Speech and behavior are normal.  ____________________________________________  ED COURSE:  Pertinent labs & imaging results that were available during my care of the patient were reviewed by me and considered in my medical decision making (see chart for details). Patient is in no acute distress, I am doubtful that she has a DVT. ____________________________________________    LABS (pertinent positives/negatives)  Labs Reviewed  CBC WITH DIFFERENTIAL/PLATELET  BASIC METABOLIC PANEL  POC URINE PREG, ED  POCT PREGNANCY, URINE    RADIOLOGY  Ultrasound of the  right lower extremity Is negative for DVT ____________________________________________  FINAL ASSESSMENT AND PLAN  Edema  Plan: Patient with labs and imaging as dictated above. Patient presented for edema in her right leg. Labs  and ultrasound are unremarkable. She is stable for outpatient follow-up with her doctor.   Emily FilbertWilliams, Jacklin Zwick E, MD   Note: This dictation was prepared with Dragon dictation. Any transcriptional errors that result from this process are unintentional   Emily FilbertJonathan E Jasun Gasparini, MD 03/14/16 2006

## 2016-03-14 NOTE — ED Notes (Addendum)
Pt reports swelling of right ankle, negative homans sign, pt reports having anxiety, minimal swelling noted to right calf

## 2016-07-05 IMAGING — US US EXTREM LOW VENOUS*R*
1 series · 14 of 24 positions shown · non-contrast
Comparison: None.

CLINICAL DATA: Right leg swelling for 1 day.

EXAM:
RIGHT LOWER EXTREMITY VENOUS DOPPLER ULTRASOUND
TECHNIQUE: Gray-scale sonography with graded compression, as well as color
Doppler and duplex ultrasound, were performed to evaluate the deep
venous system from the level of the common femoral vein through the
popliteal and proximal calf veins. Spectral Doppler was utilized to
evaluate flow at rest and with distal augmentation maneuvers.

[Series 1: us extrem low venous*right* · 0.08mm/px · 14 of 31 slices shown]
[im 1/31]
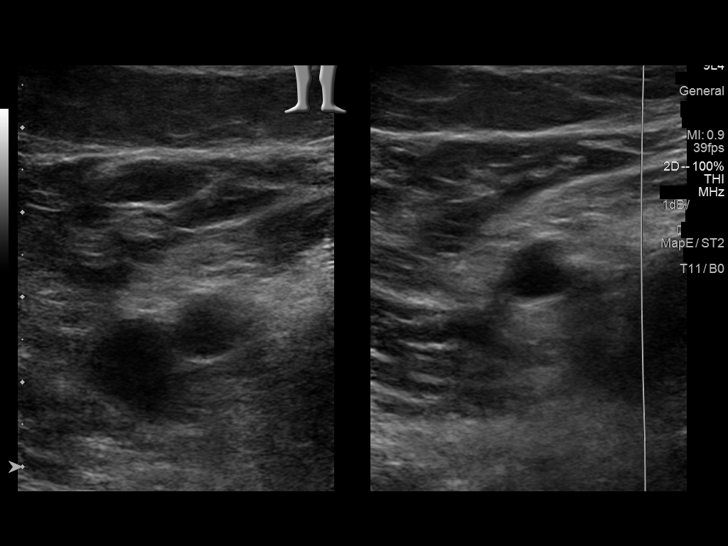
[im 3/31]
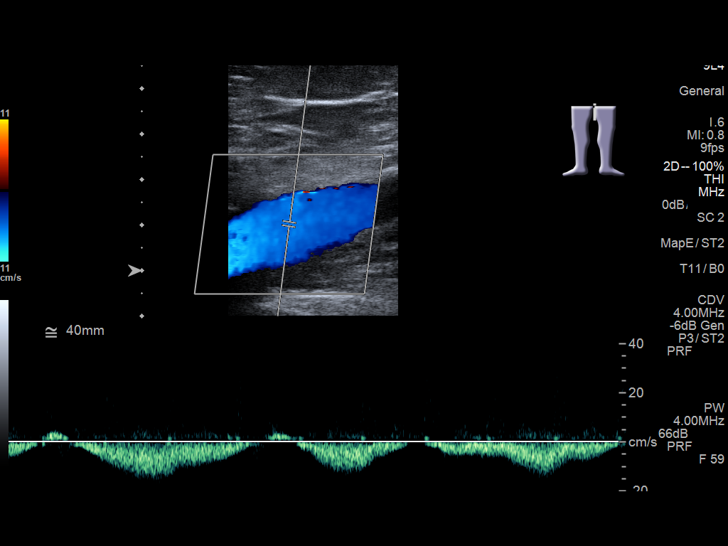
[im 6/31]
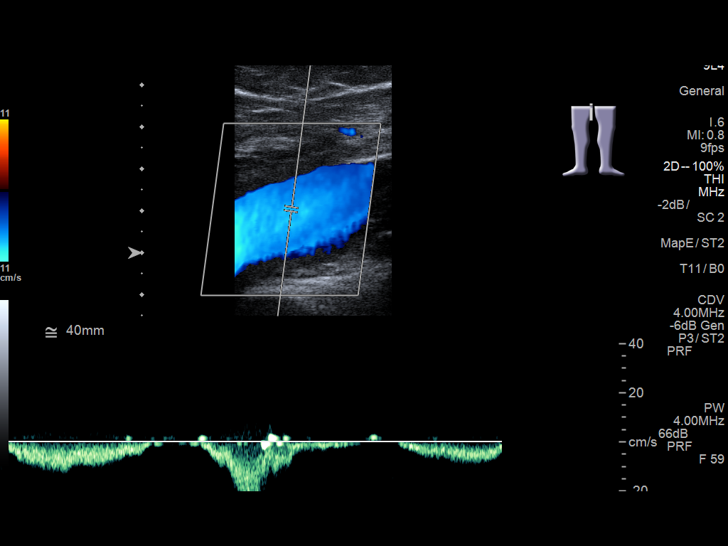
[im 8/31]
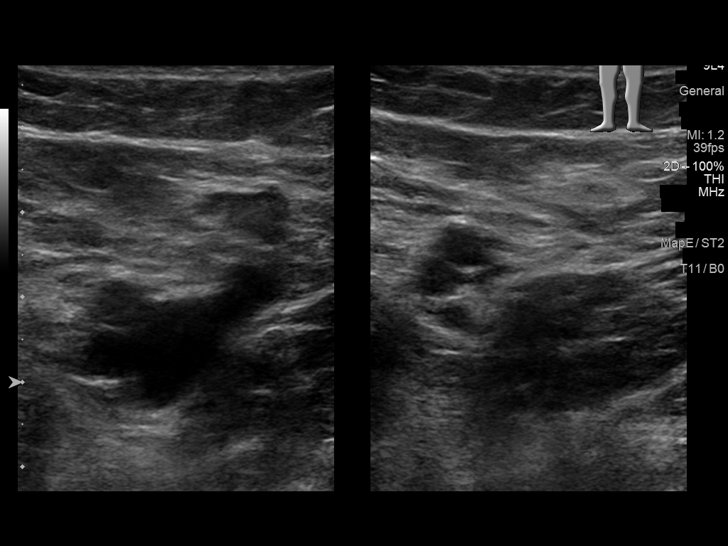
[im 10/31]
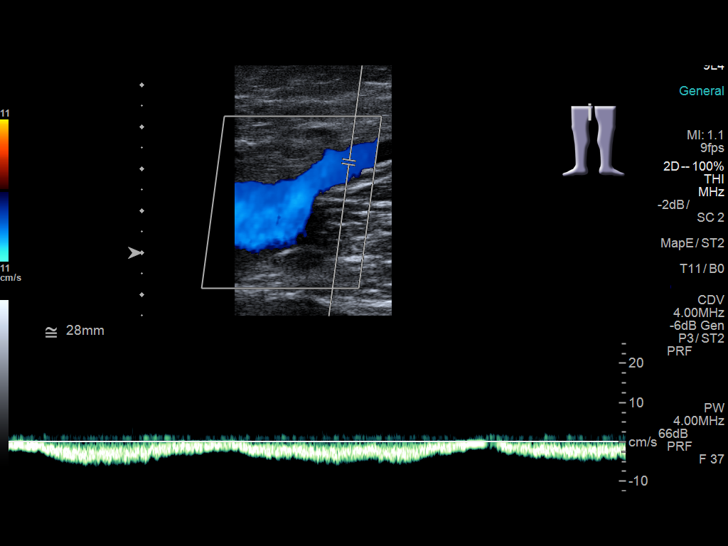
[im 12/31]
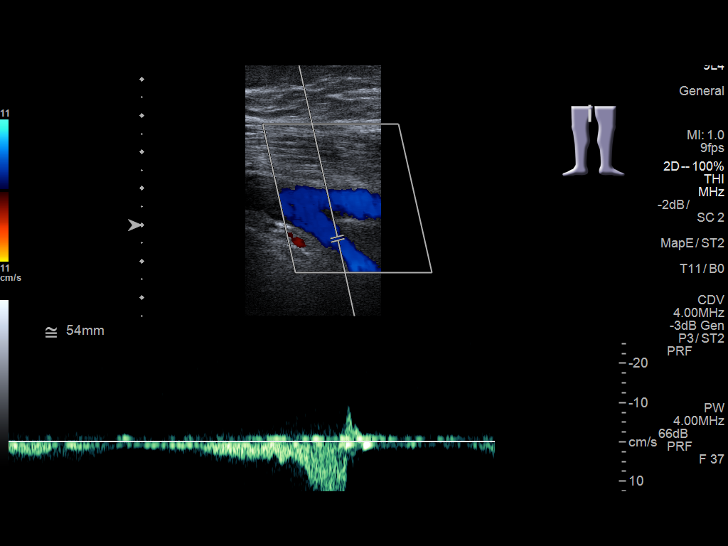
[im 15/31]
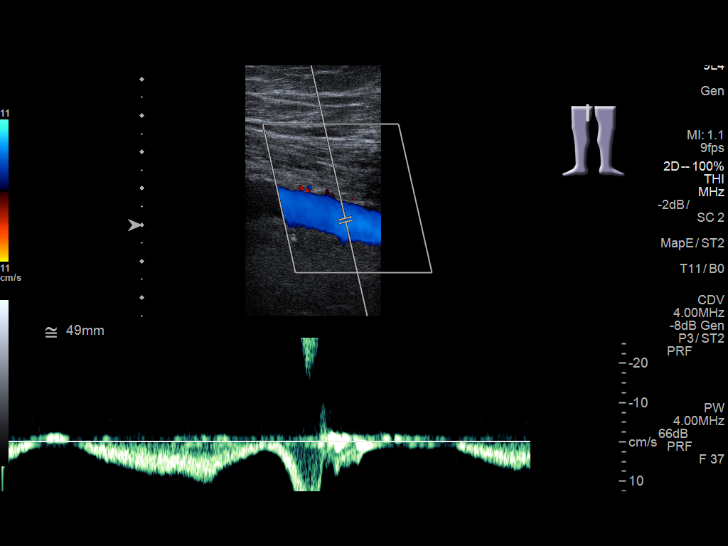
[im 16/31]
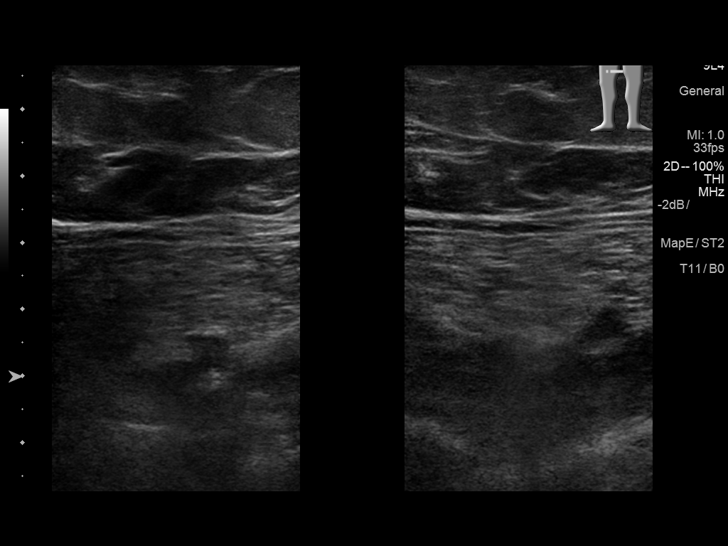
[im 19/31]
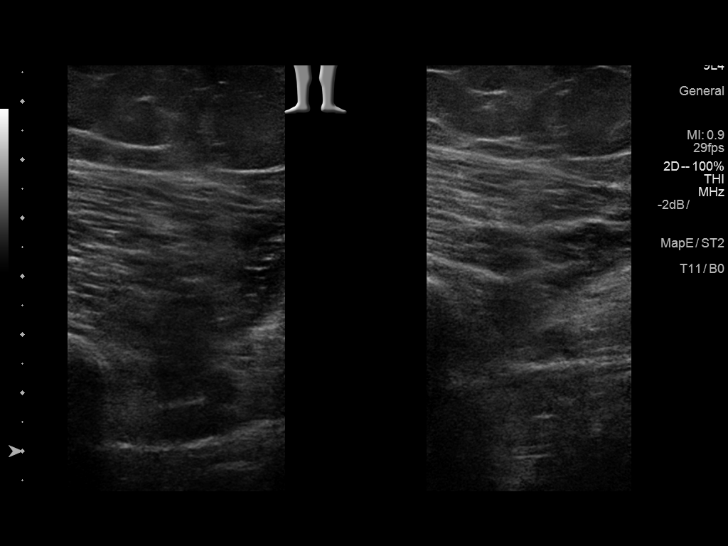
[im 21/31]
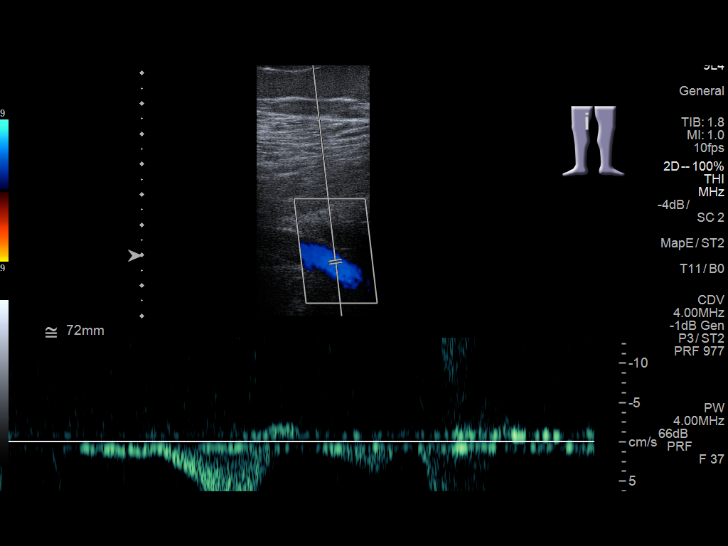
[im 24/31]
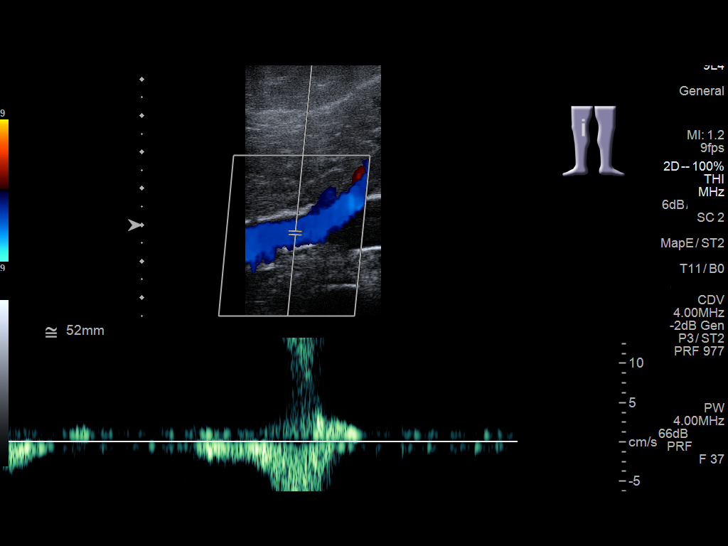
[im 25/31]
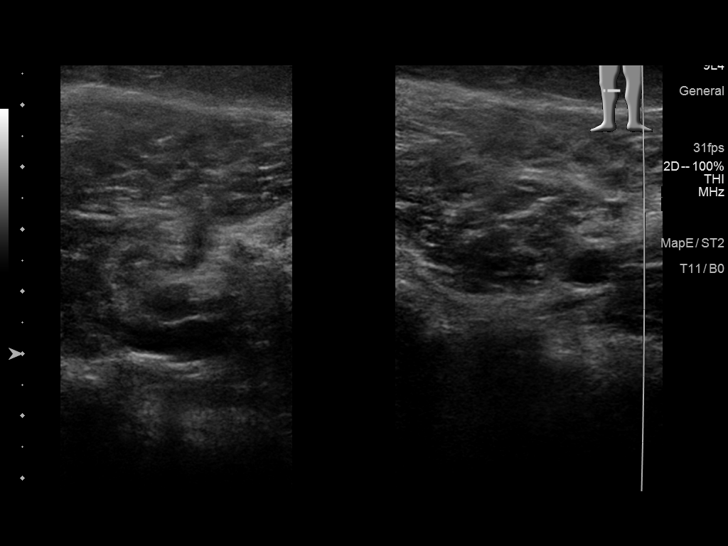
[im 28/31]
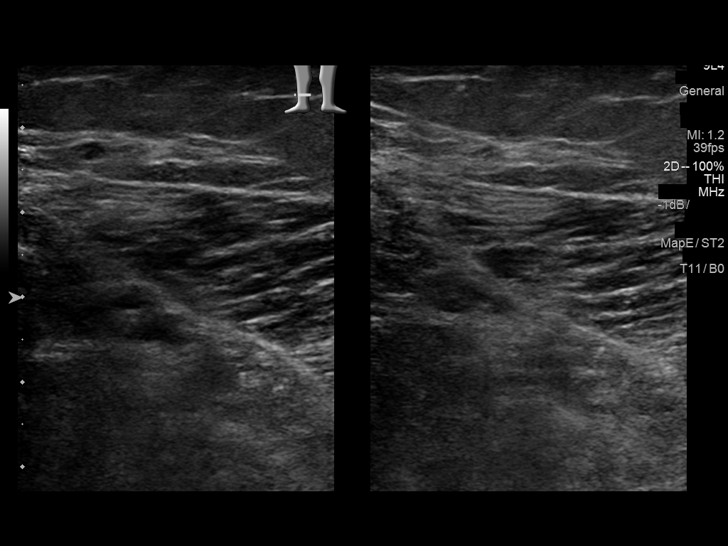
[im 31/31]
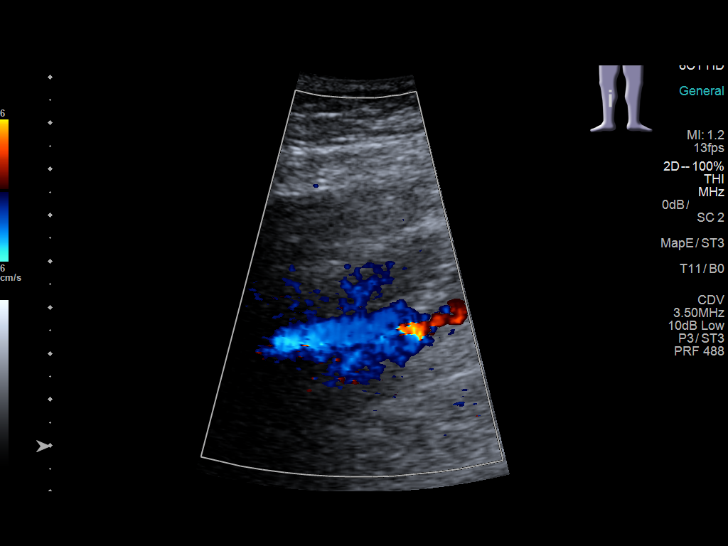

[14 of 24 positions shown; findings below may reference images not displayed]

FINDINGS: Left common femoral vein is patent without thrombus.

Normal compressibility, augmentation and color Doppler flow in the
right common femoral vein, right femoral vein and right popliteal
vein. The right saphenofemoral junction is patent. Right profunda
femoral vein is patent without thrombus. Visualized right deep calf
veins are patent without thrombus.
IMPRESSION: Negative for deep venous thrombosis in right lower extremity.

## 2017-12-09 ENCOUNTER — Emergency Department
Admission: EM | Admit: 2017-12-09 | Discharge: 2017-12-09 | Disposition: A | Discharge status: Home / Self Care | Payer: Medicaid Other | Attending: Emergency Medicine | Admitting: Emergency Medicine

## 2017-12-09 ENCOUNTER — Other Ambulatory Visit: Payer: Self-pay

## 2017-12-09 ENCOUNTER — Emergency Department: Payer: Medicaid Other

## 2017-12-09 ENCOUNTER — Encounter: Payer: Self-pay | Admitting: Emergency Medicine

## 2017-12-09 DIAGNOSIS — E876 Hypokalemia: Secondary | ICD-10-CM | POA: Diagnosis not present

## 2017-12-09 DIAGNOSIS — R079 Chest pain, unspecified: Secondary | ICD-10-CM | POA: Diagnosis present

## 2017-12-09 DIAGNOSIS — F17210 Nicotine dependence, cigarettes, uncomplicated: Secondary | ICD-10-CM | POA: Diagnosis not present

## 2017-12-09 DIAGNOSIS — Z79899 Other long term (current) drug therapy: Secondary | ICD-10-CM | POA: Insufficient documentation

## 2017-12-09 LAB — COMPREHENSIVE METABOLIC PANEL
ALT: 20 U/L (ref 14–54)
AST: 24 U/L (ref 15–41)
Albumin: 3.7 g/dL (ref 3.5–5.0)
Alkaline Phosphatase: 81 U/L (ref 38–126)
Anion gap: 10 (ref 5–15)
BILIRUBIN TOTAL: 0.6 mg/dL (ref 0.3–1.2)
BUN: 18 mg/dL (ref 6–20)
CO2: 23 mmol/L (ref 22–32)
Calcium: 8.9 mg/dL (ref 8.9–10.3)
Chloride: 103 mmol/L (ref 101–111)
Creatinine, Ser: 0.82 mg/dL (ref 0.44–1.00)
GFR calc Af Amer: >60 mL/min (ref >60)
GFR calc non Af Amer: >60 mL/min (ref >60)
Glucose, Bld: 116 mg/dL — ABNORMAL HIGH (ref 65–99)
Potassium: 3.2 mmol/L — ABNORMAL LOW (ref 3.5–5.1)
Sodium: 136 mmol/L (ref 135–145)
TOTAL PROTEIN: 7.5 g/dL (ref 6.5–8.1)

## 2017-12-09 LAB — CBC
HEMATOCRIT: 35.6 % (ref 35.0–47.0)
Hemoglobin: 11.6 g/dL — ABNORMAL LOW (ref 12.0–16.0)
MCH: 23.3 pg — ABNORMAL LOW (ref 26.0–34.0)
MCHC: 32.5 g/dL (ref 32.0–36.0)
MCV: 71.6 fL — AB (ref 80.0–100.0)
PLATELETS: 305 10*3/uL (ref 150–440)
RBC: 4.97 MIL/uL (ref 3.80–5.20)
RDW: 15.7 % — ABNORMAL HIGH (ref 11.5–14.5)
WBC: 5.2 10*3/uL (ref 3.6–11.0)

## 2017-12-09 LAB — TROPONIN I: Troponin I: <0.03 ng/mL (ref <0.03)

## 2017-12-09 MED ORDER — POTASSIUM CHLORIDE CRYS ER 20 MEQ PO TBCR: 40.0000 meq | EXTENDED_RELEASE_TABLET | Freq: Once | ORAL | Status: DC

## 2017-12-09 NOTE — ED Triage Notes (Signed)
FIRST NURSE NOTE -here for CP. Pulled next for EKG. NAD. ambulatory

## 2017-12-09 NOTE — ED Triage Notes (Signed)
States began chest pain 30 min prior to arrival. States feels like brick sitting on her chest. States feels SOB with this. Denies cardiac history.

## 2017-12-09 NOTE — Discharge Instructions (Signed)
You are evaluated for chest discomfort, and although no certain cause was found, your exam and evaluation are reassuring in the emergency department today.  Return to the emergency department immediately for any worsening condition including new or worsening or uncontrolled chest pain, nausea, sweats, dizziness or passing out, weakness, numbness, trouble breathing, fevers, or any other symptoms concerning to you.  Please return to emergency department immediately for any worsening depression or thoughts of wanting to hurt herself or others.

## 2017-12-09 NOTE — ED Notes (Addendum)
Pt in hall telling edp that she was ready to go. epd asked her to wait until d/c papers were printed but pt refused. Pt was asked to wait for med to be given and vs to be taken. She refused saying that she could take her temp at home.   Pt had been made aware by me earlier that I would be drawing a troponin at 1400 and at that time pt seemed ok with the delay.  Pt refused to sign for d/c

## 2017-12-09 NOTE — ED Notes (Signed)
Pt a/o. Able to ambulate from lobby to room.

## 2017-12-09 NOTE — ED Provider Notes (Signed)
[facility] Emergency Department Provider Note ____________________________________________   I have reviewed the triage vital signs and the triage nursing note.  HISTORY  Chief Complaint Chest Pain   Historian Patient  HPI Deborah Bradford is a 37 y.o. female presenting for complaint of chest pain.  States it started this morning but she is not quite sure exactly what time.  She states she has not slept much since Friday.  She is having a social stressor going on with regard to false accusation at work which is extremely stressful for her.  She is tearful, but states that she is not having any suicidal homicidal thoughts.  She is treated for anxiety with Valium by her primary care doctor.  She states symptoms previously of chest pain have been thought to be due to anxiety versus GERD.  Today felt a little stronger and felt like pressure which is why she decided to come and get checked out.  No trouble breathing or pleuritic chest pain.  No coughing or fevers.   Past Medical History:  Diagnosis Date  . Anxiety     There are no active problems to display for this patient.   History reviewed. No pertinent surgical history.  Prior to Admission medications   Medication Sig Start Date End Date Taking? Authorizing Provider  ibuprofen (ADVIL,MOTRIN) 800 MG tablet Take 1 tablet (800 mg total) by mouth every 8 (eight) hours as needed for mild pain or moderate pain. 07/06/15   Renford Dills, NP  methocarbamol (ROBAXIN) 500 MG tablet Take 1 tablet (500 mg total) by mouth 2 (two) times daily as needed for muscle spasms. 05/16/15   Everlene Farrier, PA-C  naproxen (NAPROSYN) 250 MG tablet Take 1 tablet (250 mg total) by mouth 2 (two) times daily with a meal. 05/16/15   Everlene Farrier, PA-C  penicillin v potassium (VEETID) 500 MG tablet Take 1 tablet (500 mg total) by mouth 4 (four) times daily. 07/06/15   Renford Dills, NP    Allergies  Allergen Reactions  .  Flagyl [Metronidazole]     No family history on file.  Social History Social History   Tobacco Use  . Smoking status: Current Every Day Smoker    Packs/day: 0.50    Types: Cigarettes  Substance Use Topics  . Alcohol use: Yes  . Drug use: No    Review of Systems  Constitutional: Negative for fever. Eyes: Negative for visual changes. ENT: Negative for sore throat. Cardiovascular: Positive for chest pressure/pain as per HPI.   Respiratory: Negative for shortness of breath. Gastrointestinal: Negative for abdominal pain, vomiting and diarrhea. Genitourinary: Negative for dysuria. Musculoskeletal: Negative for back pain. Skin: Negative for rash. Neurological: Negative for headache.  ____________________________________________   PHYSICAL EXAM:  VITAL SIGNS: ED Triage Vitals [12/09/17 1059]  Enc Vitals Group     BP      Pulse      Resp      Temp      Temp src      SpO2      Weight 237 lb (107.5 kg)     Height 5\' 2"  (1.575 m)     Head Circumference      Peak Flow      Pain Score 5     Pain Loc      Pain Edu?      Excl. in GC?      Constitutional: Alert and oriented. Well appearing and in no distress. HEENT   Head: Normocephalic and atraumatic.  Eyes: Conjunctivae are normal. Pupils equal and round.       Ears:         Nose: No congestion/rhinnorhea.   Mouth/Throat: Mucous membranes are moist.   Neck: No stridor. Cardiovascular/Chest: Normal rate, regular rhythm.  No murmurs, rubs, or gallops. Respiratory: Normal respiratory effort without tachypnea nor retractions. Breath sounds are clear and equal bilaterally. No wheezes/rales/rhonchi. Gastrointestinal: Soft. No distention, no guarding, no rebound. Nontender.    Genitourinary/rectal:Deferred Musculoskeletal: Nontender with normal range of motion in all extremities. No joint effusions.  No lower extremity tenderness.  No edema. Neurologic:  Normal speech and language. No gross or focal  neurologic deficits are appreciated. Skin:  Skin is warm, dry and intact. No rash noted. Psychiatric: Mood and affect are normal. Speech and behavior are normal. Patient exhibits appropriate insight and judgment.   ____________________________________________  LABS (pertinent positives/negatives) I, Cheral Cappucci, MD the attenGovernor Rooksding physician have reviewed the labs noted below.  Labs Reviewed  CBC - Abnormal; Notable for the following components:      Result Value   Hemoglobin 11.6 (*)    MCV 71.6 (*)    MCH 23.3 (*)    RDW 15.7 (*)    All other components within normal limits  COMPREHENSIVE METABOLIC PANEL - Abnormal; Notable for the following components:   Potassium 3.2 (*)    Glucose, Bld 116 (*)    All other components within normal limits  TROPONIN I  TROPONIN I    ____________________________________________    EKG I, Governor Rooksebecca Danayah Smyre, MD, the attending physician have personally viewed and interpreted all ECGs.  78 bpm.  Normal sinus rhythm.  Narrow QRS.  Normal axis.  Nonspecific ST and T wave ____________________________________________  RADIOLOGY All Xrays were viewed by me.  Imaging interpreted by Radiologist, and I, Governor Rooksebecca Pansy Ostrovsky, MD the attending physician have reviewed the radiologist interpretation noted below.  Chest x-ray two-view:  IMPRESSION: No active cardiopulmonary disease. __________________________________________  PROCEDURES  Procedure(s) performed: None  Critical Care performed: None   ____________________________________________  ED COURSE / ASSESSMENT AND PLAN  Pertinent labs & imaging results that were available during my care of the patient were reviewed by me and considered in my medical decision making (see chart for details).    Description of chest discomfort is nonspecific and EKG is reassuring.  Initial laboratory studies are reassuring.  She has a mild hypokalemia.  Initial troponin negative.  I am going to repeat a 3-hour  troponin level for 2 PM.  Chest x-ray was added on is reassuring and normal in appearance.  Patient broke down in tears discussing the social situation that she is currently under which is distressing.  She does not meet any criteria for emergency psychiatric evaluation/involuntary commitment at this point in time, and she has a good relationship with her primary care doctor.  I have asked her to get in touch with her primary care doctor with regard to any ongoing anxiety symptoms and likely for referral to psychiatry/counselor to deal with the social stressors here recently.  On reevaluation, we discussed reassuring chest x-ray.  We discussed mildly low potassium.  Patient was quite frustrated and stated that she was leaving.  She states she is not having discomfort anymore and was going to go home.  She was frustrated that she did not have a set of vital signs.  I am also frustrated by this as well.  I asked her to allow me to do a set of vital signs right now.  She was pulled back immediately prior to triage into a room, and apparently did not get a set of vital signs as I saw her immediately.  She refused and did not want to do vital signs now.  Patient stated that she did not want to stay for repeat troponin or potassium repletion.  We discussed the risk of missed heart attack and death.  She is alert and oriented and capable of making her own decisions.  She did go ahead and walk out without discharge instructions.    DIFFERENTIAL DIAGNOSIS: Differential diagnosis includes, but is not limited to, ACS, aortic dissection, pulmonary embolism, cardiac tamponade, pneumothorax, pneumonia, pericarditis, myocarditis, GI-related causes including esophagitis/gastritis, and musculoskeletal chest wall pain.    CONSULTATIONS:   None   Patient / Family / Caregiver informed of clinical course, medical decision-making process, and agree with plan.   I discussed return precautions, follow-up instructions,  and discharge instructions with patient and/or family.  Discharge Instructions prepared but not given: ---  You are evaluated for chest discomfort, and although no certain cause was found, your exam and evaluation are reassuring in the emergency department today.  Return to the emergency department immediately for any worsening condition including new or worsening or uncontrolled chest pain, nausea, sweats, dizziness or passing out, weakness, numbness, trouble breathing, fevers, or any other symptoms concerning to you.  Please return to emergency department immediately for any worsening depression or thoughts of wanting to hurt herself or others.    ___________________________________________   FINAL CLINICAL IMPRESSION(S) / ED DIAGNOSES   Final diagnoses:  Hypokalemia  Nonspecific chest pain      ___________________________________________        Note: This dictation was prepared with Dragon dictation. Any transcriptional errors that result from this process are unintentional    Governor Rooks, MD 12/09/17 1323

## 2017-12-09 NOTE — ED Notes (Signed)
Pt in c pod. Not on monitor at this time.

## 2017-12-11 DIAGNOSIS — K0889 Other specified disorders of teeth and supporting structures: Secondary | ICD-10-CM | POA: Diagnosis not present

## 2017-12-11 DIAGNOSIS — Z5321 Procedure and treatment not carried out due to patient leaving prior to being seen by health care provider: Secondary | ICD-10-CM | POA: Diagnosis not present

## 2017-12-12 ENCOUNTER — Other Ambulatory Visit: Payer: Self-pay

## 2017-12-12 ENCOUNTER — Emergency Department
Admission: EM | Admit: 2017-12-12 | Discharge: 2017-12-12 | Discharge status: Against Medical Advice | Payer: Medicaid Other | Attending: Emergency Medicine | Admitting: Emergency Medicine

## 2017-12-12 ENCOUNTER — Encounter: Payer: Self-pay | Admitting: Emergency Medicine

## 2017-12-12 LAB — GROUP A STREP BY PCR: Group A Strep by PCR: NOT DETECTED

## 2017-12-12 NOTE — ED Triage Notes (Signed)
Pt presents to ED with c/o right sided tooth pain for the past several days with sore throat and swollen tonsils. Pt had called her dentist and was prescribed amoxicillin; appt made for Friday. Pt reports taking 6 doses of her antibiotic but is not feeling better. Was told if not improved to come to ED.

## 2017-12-12 NOTE — ED Notes (Signed)
Pt seen leaving from STAT desk. Pt did not announce leaving. When called pt to speak with her prior to leaving she looked back and then continued walking out of the waiting room door.

## 2018-01-02 ENCOUNTER — Other Ambulatory Visit: Payer: Self-pay

## 2018-01-02 ENCOUNTER — Emergency Department: Payer: Medicaid Other

## 2018-01-02 ENCOUNTER — Emergency Department
Admission: EM | Admit: 2018-01-02 | Discharge: 2018-01-02 | Disposition: A | Discharge status: Home / Self Care | Payer: Medicaid Other | Attending: Emergency Medicine | Admitting: Emergency Medicine

## 2018-01-02 ENCOUNTER — Ambulatory Visit: Admission: RE | Admit: 2018-01-02 | Payer: Medicaid Other | Source: Ambulatory Visit

## 2018-01-02 DIAGNOSIS — R0602 Shortness of breath: Secondary | ICD-10-CM | POA: Insufficient documentation

## 2018-01-02 DIAGNOSIS — Z5321 Procedure and treatment not carried out due to patient leaving prior to being seen by health care provider: Secondary | ICD-10-CM | POA: Insufficient documentation

## 2018-01-02 DIAGNOSIS — R079 Chest pain, unspecified: Secondary | ICD-10-CM | POA: Diagnosis present

## 2018-01-02 HISTORY — DX: Essential (primary) hypertension: I10

## 2018-01-02 HISTORY — DX: Gastro-esophageal reflux disease without esophagitis: K21.9

## 2018-01-02 LAB — BASIC METABOLIC PANEL
Anion gap: 11 (ref 5–15)
BUN: 14 mg/dL (ref 6–20)
CHLORIDE: 105 mmol/L (ref 101–111)
CO2: 22 mmol/L (ref 22–32)
CREATININE: 0.93 mg/dL (ref 0.44–1.00)
Calcium: 9.2 mg/dL (ref 8.9–10.3)
GFR calc non Af Amer: >60 mL/min (ref >60)
Glucose, Bld: 100 mg/dL — ABNORMAL HIGH (ref 65–99)
POTASSIUM: 3.1 mmol/L — AB (ref 3.5–5.1)
SODIUM: 138 mmol/L (ref 135–145)

## 2018-01-02 LAB — CBC
HEMATOCRIT: 35.7 % (ref 35.0–47.0)
HEMOGLOBIN: 11.6 g/dL — AB (ref 12.0–16.0)
MCH: 23.6 pg — ABNORMAL LOW (ref 26.0–34.0)
MCHC: 32.5 g/dL (ref 32.0–36.0)
MCV: 72.7 fL — AB (ref 80.0–100.0)
PLATELETS: 320 10*3/uL (ref 150–440)
RBC: 4.91 MIL/uL (ref 3.80–5.20)
RDW: 15.7 % — ABNORMAL HIGH (ref 11.5–14.5)
WBC: 5.9 10*3/uL (ref 3.6–11.0)

## 2018-01-02 LAB — TROPONIN I: Troponin I: <0.03 ng/mL (ref <0.03)

## 2018-01-02 NOTE — ED Notes (Signed)
Attempted to call pt in lobby to bring back to treatment room. No answer. First nurse notified.

## 2018-01-02 NOTE — ED Notes (Signed)
Called for room placement  No answer in lobby

## 2018-01-02 NOTE — ED Notes (Signed)
Called for x-ray  Did not answer

## 2018-01-02 NOTE — ED Triage Notes (Signed)
Pt reports that she started with chest pain this afternoon and shortness of breath - she states that she started 2 days ago with sweats and shills - she states that it feels like "pressure is sitting on my chest"

## 2018-01-02 NOTE — ED Notes (Signed)
Called again for x-ray and room placement  No answer in lobby

## 2019-04-12 ENCOUNTER — Other Ambulatory Visit: Payer: Self-pay

## 2019-04-12 ENCOUNTER — Encounter: Payer: Self-pay | Admitting: Emergency Medicine

## 2019-04-12 ENCOUNTER — Emergency Department: Payer: HRSA Program

## 2019-04-12 ENCOUNTER — Emergency Department
Admission: EM | Admit: 2019-04-12 | Discharge: 2019-04-12 | Disposition: A | Discharge status: Home / Self Care | Payer: HRSA Program | Attending: Emergency Medicine | Admitting: Emergency Medicine

## 2019-04-12 DIAGNOSIS — Z79899 Other long term (current) drug therapy: Secondary | ICD-10-CM | POA: Diagnosis not present

## 2019-04-12 DIAGNOSIS — B349 Viral infection, unspecified: Secondary | ICD-10-CM | POA: Insufficient documentation

## 2019-04-12 DIAGNOSIS — Z20828 Contact with and (suspected) exposure to other viral communicable diseases: Secondary | ICD-10-CM | POA: Insufficient documentation

## 2019-04-12 DIAGNOSIS — F17210 Nicotine dependence, cigarettes, uncomplicated: Secondary | ICD-10-CM | POA: Diagnosis not present

## 2019-04-12 DIAGNOSIS — I10 Essential (primary) hypertension: Secondary | ICD-10-CM | POA: Insufficient documentation

## 2019-04-12 DIAGNOSIS — R509 Fever, unspecified: Secondary | ICD-10-CM | POA: Diagnosis present

## 2019-04-12 LAB — URINALYSIS, COMPLETE (UACMP) WITH MICROSCOPIC
Bacteria, UA: NONE SEEN
Bilirubin Urine: NEGATIVE
Glucose, UA: NEGATIVE mg/dL
Hgb urine dipstick: NEGATIVE
Ketones, ur: NEGATIVE mg/dL
Leukocytes,Ua: NEGATIVE
Nitrite: NEGATIVE
Protein, ur: NEGATIVE mg/dL
Specific Gravity, Urine: 1.012 (ref 1.005–1.030)
pH: 5 (ref 5.0–8.0)

## 2019-04-12 LAB — CBC WITH DIFFERENTIAL/PLATELET
Abs Immature Granulocytes: 0.03 10*3/uL (ref 0.00–0.07)
Basophils Absolute: 0 10*3/uL (ref 0.0–0.1)
Basophils Relative: 0 %
Eosinophils Absolute: 0.4 10*3/uL (ref 0.0–0.5)
Eosinophils Relative: 5 %
HCT: 36.6 % (ref 36.0–46.0)
Hemoglobin: 11.2 g/dL — ABNORMAL LOW (ref 12.0–15.0)
Immature Granulocytes: 0 %
Lymphocytes Relative: 30 %
Lymphs Abs: 2.1 10*3/uL (ref 0.7–4.0)
MCH: 22.2 pg — ABNORMAL LOW (ref 26.0–34.0)
MCHC: 30.6 g/dL (ref 30.0–36.0)
MCV: 72.5 fL — ABNORMAL LOW (ref 80.0–100.0)
Monocytes Absolute: 0.6 10*3/uL (ref 0.1–1.0)
Monocytes Relative: 8 %
Neutro Abs: 3.9 10*3/uL (ref 1.7–7.7)
Neutrophils Relative %: 57 %
Platelets: 330 10*3/uL (ref 150–400)
RBC: 5.05 MIL/uL (ref 3.87–5.11)
RDW: 14.8 % (ref 11.5–15.5)
WBC: 7 10*3/uL (ref 4.0–10.5)
nRBC: 0 % (ref 0.0–0.2)

## 2019-04-12 LAB — COMPREHENSIVE METABOLIC PANEL
ALT: 19 U/L (ref 0–44)
AST: 20 U/L (ref 15–41)
Albumin: 3.9 g/dL (ref 3.5–5.0)
Alkaline Phosphatase: 71 U/L (ref 38–126)
Anion gap: 7 (ref 5–15)
BUN: 21 mg/dL — ABNORMAL HIGH (ref 6–20)
CO2: 23 mmol/L (ref 22–32)
Calcium: 9.4 mg/dL (ref 8.9–10.3)
Chloride: 104 mmol/L (ref 98–111)
Creatinine, Ser: 0.77 mg/dL (ref 0.44–1.00)
GFR calc Af Amer: >60 mL/min (ref >60)
GFR calc non Af Amer: >60 mL/min (ref >60)
Glucose, Bld: 106 mg/dL — ABNORMAL HIGH (ref 70–99)
Potassium: 3.7 mmol/L (ref 3.5–5.1)
Sodium: 134 mmol/L — ABNORMAL LOW (ref 135–145)
Total Bilirubin: 0.4 mg/dL (ref 0.3–1.2)
Total Protein: 8 g/dL (ref 6.5–8.1)

## 2019-04-12 LAB — LACTIC ACID, PLASMA: Lactic Acid, Venous: 0.8 mmol/L (ref 0.5–1.9)

## 2019-04-12 LAB — PREGNANCY, URINE: Preg Test, Ur: NEGATIVE

## 2019-04-12 LAB — SARS CORONAVIRUS 2 BY RT PCR (HOSPITAL ORDER, PERFORMED IN ~~LOC~~ HOSPITAL LAB): SARS Coronavirus 2: NEGATIVE

## 2019-04-12 LAB — TROPONIN I: Troponin I: <0.03 ng/mL (ref <0.03)

## 2019-04-12 NOTE — ED Triage Notes (Signed)
PT states headache, fever, cough, SHOB starting today.  Pt is CNA at [employer].

## 2019-04-12 NOTE — Discharge Instructions (Signed)
The chest x-ray coronavirus test and blood work are all negative.  I will let you go.  Please use Tylenol if you needed for fever.  Please return here if you get a fever over 101 for recheck.  Also return here if you are getting more shortness of breath or just feeling sicker.  Do not forget we had the bargain that she quit smoking after 2 more cigarettes if your coronavirus test was negative.

## 2019-04-12 NOTE — ED Provider Notes (Signed)
[facility] Emergency Department Provider Note   ____________________________________________   First MD Initiated Contact with Patient 04/12/19 [phone]     (approximate)  I have reviewed the triage vital signs and the nursing notes.   HISTORY  Chief Complaint Fever; Shortness of Breath; and Cough   HPI Deborah Bradford is a 38 y.o. female patient works at [employer].  They are having a COVID outbreak there.  Patient is complaining about 1 day of shortness of breath and nonproductive cough.  She is little achy but is often achy.  She had a low-grade fever today at work 99.  She also has a headache.  She came here from work.        Past Medical History:  Diagnosis Date  . Anxiety   . GERD (gastroesophageal reflux disease)   . Hypertension     There are no active problems to display for this patient.   Past Surgical History:  Procedure Laterality Date  . WISDOM TOOTH EXTRACTION      Prior to Admission medications   Medication Sig Start Date End Date Taking? Authorizing Provider  diazepam (VALIUM) 2 MG tablet TAKE 1 TABLET (2 MG TOTAL) BY MOUTH EVERY 8 (EIGHT) HOURS AS NEEDED FOR ANXIETY 02/23/19  Yes [provider]  famotidine (PEPCID) 40 MG tablet Take 40 mg by mouth 2 (two) times daily. 03/26/19 03/25/20 Yes [provider]  hydrochlorothiazide (HYDRODIURIL) 12.5 MG tablet Take 12.5 mg by mouth daily. 07/04/18 07/04/19 Yes [provider]  omeprazole (PRILOSEC) 20 MG capsule Take 20 mg by mouth 2 (two) times daily. 07/04/18 07/04/19 Yes [provider]    Allergies Bupropion and Flagyl [metronidazole]  History reviewed. No pertinent family history.  Social History Social History   Tobacco Use  . Smoking status: Current Every Day Smoker    Packs/day: 0.50    Types: Cigarettes  . Smokeless tobacco: Never Used  Substance Use Topics  . Alcohol use: No    Frequency: Never  . Drug use: No    Review of  Systems  Constitutional:  fever/chills Eyes: No visual changes. ENT: No sore throat. Cardiovascular: Denies chest pain. Respiratory:  shortness of breath. Gastrointestinal: No abdominal pain.  No nausea, no vomiting.  No diarrhea.  No constipation. Genitourinary: Negative for dysuria. Musculoskeletal: Negative for back pain. Skin: Negative for rash. Neurological: Negative for headaches, focal weakness  ____________________________________________   PHYSICAL EXAM:  VITAL SIGNS: ED Triage Vitals  Enc Vitals Group     BP 04/12/19 0907 139/89     Pulse Rate 04/12/19 0907 99     Resp 04/12/19 0907 18     Temp 04/12/19 0907 99.1 F (37.3 C)     Temp Source 04/12/19 0907 Oral     SpO2 04/12/19 0907 99 %     Weight 04/12/19 0908 230 lb (104.3 kg)     Height 04/12/19 0908 5\' 2"  (1.575 m)     Head Circumference --      Peak Flow --      Pain Score 04/12/19 0907 2     Pain Loc --      Pain Edu? --      Excl. in GC? --     Constitutional: Alert and oriented. Well appearing and in no acute distress. Eyes: Conjunctivae are normal.  Head: Atraumatic. Nose: No congestion/rhinnorhea. Mouth/Throat: Mucous membranes are moist.  Oropharynx non-erythematous. Neck: No stridor.   Cardiovascular: Normal rate, regular rhythm. Grossly normal heart sounds.  Good peripheral circulation. Respiratory: Normal respiratory effort.  No retractions. Lungs CTAB. Gastrointestinal: Soft and nontender. No distention. No abdominal bruits. No CVA tenderness. Musculoskeletal: No lower extremity tenderness nor edema. Neurologic:  Normal speech and language. No gross focal neurologic deficits are appreciated.  Skin:  Skin is warm, dry and intact. No rash noted.   ____________________________________________   LABS (all labs ordered are listed, but only abnormal results are displayed)  Labs Reviewed  COMPREHENSIVE METABOLIC PANEL - Abnormal; Notable for the following components:      Result Value    Sodium 134 (*)    Glucose, Bld 106 (*)    BUN 21 (*)    All other components within normal limits  CBC WITH DIFFERENTIAL/PLATELET - Abnormal; Notable for the following components:   Hemoglobin 11.2 (*)    MCV 72.5 (*)    MCH 22.2 (*)    All other components within normal limits  URINALYSIS, COMPLETE (UACMP) WITH MICROSCOPIC - Abnormal; Notable for the following components:   Color, Urine YELLOW (*)    APPearance HAZY (*)    All other components within normal limits  SARS CORONAVIRUS 2 (HOSPITAL ORDER, PERFORMED IN [facility])  CULTURE, BLOOD (ROUTINE X 2)  CULTURE, BLOOD (ROUTINE X 2)  SARS CORONAVIRUS 2 (HOSPITAL ORDER, PERFORMED IN [facility])  CULTURE, BLOOD (ROUTINE X 2) W REFLEX TO ID PANEL  TROPONIN I  LACTIC ACID, PLASMA  PREGNANCY, URINE   ____________________________________________  EKG EKG read interpreted by me shows sinus rhythm at 74 normal axis no acute ST-T wave changes  ____________________________________________  RADIOLOGY  ED MD interpretation: This x-ray read by radiology reviewed by me is clear  Official radiology report(s): Dg Chest Portable 1 View  Result Date: 04/12/2019 CLINICAL DATA:  38 year old female with a history of headache EXAM: PORTABLE CHEST 1 VIEW COMPARISON:  12/09/2017, 07/28/2018 FINDINGS: Cardiomediastinal silhouette unchanged in size and contour. No evidence of central vascular congestion. No pneumothorax or pleural effusion. Coarsened interstitial markings. No confluent airspace disease. No displaced fracture IMPRESSION: Negative for acute cardiopulmonary disease Electronically Signed   By: Gilmer MorJaime  Wagner D.O.   On: 04/12/2019 09:31    ____________________________________________   PROCEDURES  Procedure(s) performed (including Critical Care):  Procedures   ____________________________________________   INITIAL IMPRESSION / ASSESSMENT AND PLAN / ED COURSE   Patient's chest x-ray is negative.   Patient's lungs are clear her lab work is normal and her COVID test is negative.  I will let her go home.  I will get her to come back if she has any further problems and try and stop smoking.Deborah Bradford was evaluated in Emergency Department on 04/12/2019 for the symptoms described in the history of present illness. She was evaluated in the context of the global COVID-19 pandemic, which necessitated consideration that the patient might be at risk for infection with the SARS-CoV-2 virus that causes COVID-19. Institutional protocols and algorithms that pertain to the evaluation of patients at risk for COVID-19 are in a state of rapid change based on information released by regulatory bodies including the CDC and federal and state organizations. These policies and algorithms were followed during the patient's care in the ED.             ____________________________________________   FINAL CLINICAL IMPRESSION(S) / ED DIAGNOSES  Final diagnoses:  Viral syndrome     ED Discharge Orders    None       Note:  This document was prepared using Dragon  voice recognition software and may include unintentional dictation errors.    Arnaldo Natal, MD 04/12/19 1213

## 2019-04-17 LAB — CULTURE, BLOOD (ROUTINE X 2)
Culture: NO GROWTH
Culture: NO GROWTH
Culture: NO GROWTH
Special Requests: ADEQUATE

## 2019-04-20 ENCOUNTER — Emergency Department
Admission: EM | Admit: 2019-04-20 | Discharge: 2019-04-20 | Disposition: A | Discharge status: Home / Self Care | Payer: Self-pay | Attending: Emergency Medicine | Admitting: Emergency Medicine

## 2019-04-20 ENCOUNTER — Encounter: Payer: Self-pay | Admitting: Emergency Medicine

## 2019-04-20 ENCOUNTER — Other Ambulatory Visit: Payer: Self-pay

## 2019-04-20 DIAGNOSIS — R05 Cough: Secondary | ICD-10-CM | POA: Insufficient documentation

## 2019-04-20 DIAGNOSIS — Z5321 Procedure and treatment not carried out due to patient leaving prior to being seen by health care provider: Secondary | ICD-10-CM | POA: Insufficient documentation

## 2019-04-20 NOTE — ED Triage Notes (Signed)
Cough and SOB x 2 days.  

## 2020-01-31 IMAGING — DX PORTABLE CHEST - 1 VIEW
1 series · 1 of 1 positions shown · non-contrast
Comparison: 12/09/2017, 07/28/2018

CLINICAL DATA: 37-year-old female with a history of headache

EXAM:
PORTABLE CHEST 1 VIEW

[chest ap]
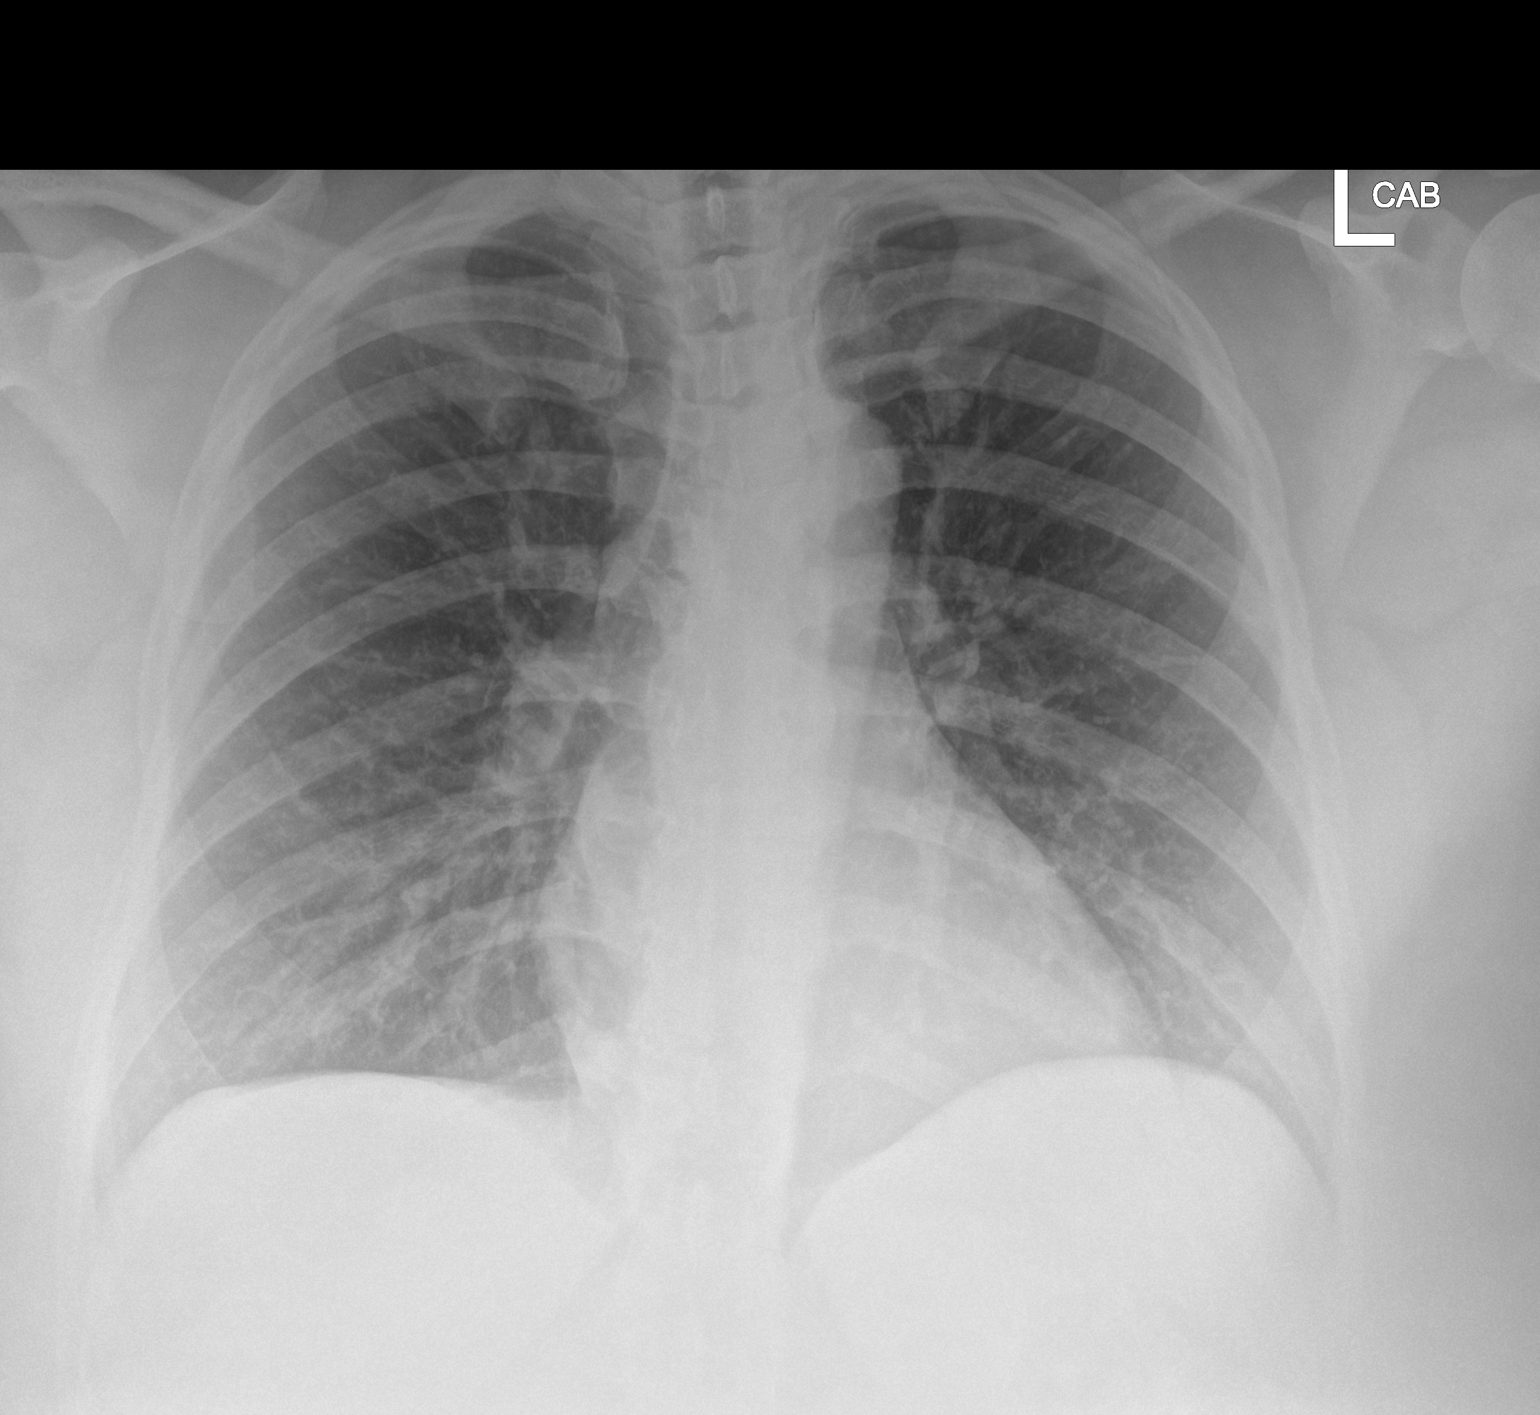

[1 of 1 positions shown; findings below may reference images not displayed]

FINDINGS: Cardiomediastinal silhouette unchanged in size and contour. No
evidence of central vascular congestion. No pneumothorax or pleural
effusion. Coarsened interstitial markings. No confluent airspace
disease. No displaced fracture
IMPRESSION: Negative for acute cardiopulmonary disease

## 2021-06-11 ENCOUNTER — Encounter: Payer: Self-pay | Admitting: Emergency Medicine

## 2021-06-11 ENCOUNTER — Emergency Department
Admission: EM | Admit: 2021-06-11 | Discharge: 2021-06-11 | Disposition: A | Discharge status: Home / Self Care | Payer: Medicaid Other | Attending: Emergency Medicine | Admitting: Emergency Medicine

## 2021-06-11 ENCOUNTER — Other Ambulatory Visit: Payer: Self-pay

## 2021-06-11 DIAGNOSIS — Z79899 Other long term (current) drug therapy: Secondary | ICD-10-CM | POA: Diagnosis not present

## 2021-06-11 DIAGNOSIS — R1110 Vomiting, unspecified: Secondary | ICD-10-CM | POA: Diagnosis not present

## 2021-06-11 DIAGNOSIS — R197 Diarrhea, unspecified: Secondary | ICD-10-CM | POA: Insufficient documentation

## 2021-06-11 DIAGNOSIS — R1013 Epigastric pain: Secondary | ICD-10-CM

## 2021-06-11 DIAGNOSIS — I10 Essential (primary) hypertension: Secondary | ICD-10-CM | POA: Insufficient documentation

## 2021-06-11 DIAGNOSIS — K5900 Constipation, unspecified: Secondary | ICD-10-CM | POA: Insufficient documentation

## 2021-06-11 DIAGNOSIS — F17210 Nicotine dependence, cigarettes, uncomplicated: Secondary | ICD-10-CM | POA: Insufficient documentation

## 2021-06-11 LAB — CBC
HCT: 37.9 % (ref 36.0–46.0)
Hemoglobin: 12 g/dL (ref 12.0–15.0)
MCH: 23.3 pg — ABNORMAL LOW (ref 26.0–34.0)
MCHC: 31.7 g/dL (ref 30.0–36.0)
MCV: 73.4 fL — ABNORMAL LOW (ref 80.0–100.0)
Platelets: 295 10*3/uL (ref 150–400)
RBC: 5.16 MIL/uL — ABNORMAL HIGH (ref 3.87–5.11)
RDW: 15.2 % (ref 11.5–15.5)
WBC: 4.7 10*3/uL (ref 4.0–10.5)
nRBC: 0 % (ref 0.0–0.2)

## 2021-06-11 LAB — COMPREHENSIVE METABOLIC PANEL
ALT: 13 U/L (ref 0–44)
AST: 15 U/L (ref 15–41)
Albumin: 3.8 g/dL (ref 3.5–5.0)
Alkaline Phosphatase: 74 U/L (ref 38–126)
Anion gap: 6 (ref 5–15)
BUN: 13 mg/dL (ref 6–20)
CO2: 25 mmol/L (ref 22–32)
Calcium: 8.9 mg/dL (ref 8.9–10.3)
Chloride: 105 mmol/L (ref 98–111)
Creatinine, Ser: 0.9 mg/dL (ref 0.44–1.00)
GFR, Estimated: >60 mL/min (ref >60)
Glucose, Bld: 108 mg/dL — ABNORMAL HIGH (ref 70–99)
Potassium: 4 mmol/L (ref 3.5–5.1)
Sodium: 136 mmol/L (ref 135–145)
Total Bilirubin: 0.6 mg/dL (ref 0.3–1.2)
Total Protein: 8 g/dL (ref 6.5–8.1)

## 2021-06-11 LAB — URINALYSIS, COMPLETE (UACMP) WITH MICROSCOPIC
Bacteria, UA: NONE SEEN
Bilirubin Urine: NEGATIVE
Glucose, UA: NEGATIVE mg/dL
Hgb urine dipstick: NEGATIVE
Ketones, ur: NEGATIVE mg/dL
Leukocytes,Ua: NEGATIVE
Nitrite: NEGATIVE
Protein, ur: NEGATIVE mg/dL
Specific Gravity, Urine: 1.028 (ref 1.005–1.030)
pH: 6 (ref 5.0–8.0)

## 2021-06-11 LAB — POC URINE PREG, ED: Preg Test, Ur: NEGATIVE

## 2021-06-11 LAB — LIPASE, BLOOD: Lipase: 24 U/L (ref 11–51)

## 2021-06-11 MED ORDER — SUCRALFATE 1 G PO TABS: 1.0000 g | ORAL_TABLET | Freq: Three times a day (TID) | ORAL | 0 refills | Status: DC

## 2021-06-11 NOTE — ED Triage Notes (Signed)
Pt comes into the ED via POV c/o upper abd pain that has been ongoing for months.  Pt states that over six months she has lost 43 lbs.  Pt states she has had major problems with her reflux and she has been taking OTC medications per her PCP ordered, but it isnt helping.  Pt states the pain is now unbearable.  Pt denies having f/u with a GI specialist.  PT ambulatory and in NAD.

## 2021-06-11 NOTE — ED Provider Notes (Signed)
[facility] Emergency Department Provider Note  ____________________________________________   Event Date/Time   First MD Initiated Contact with Patient 06/11/21 1351     (approximate)  I have reviewed the triage vital signs and the nursing notes.   HISTORY  Chief Complaint Abdominal Pain   HPI Deborah Bradford is a 40 y.o. female with a past medical history of HTN, GERD and anxiety and states she is been taking over-the-counter cold medicines prescribed her PCP for several months but is coming in requesting evaluation because she feels has had several months of abdominal pain that has been getting worse.  She endorses intermittent but nonbilious pharmacist and nonbloody diarrhea alternating with constipation.  She denies significant NSAID use recently although states she has used NSAIDs in the past.  She denies any recent EtOH use.  She denies any urinary symptoms, chest pain, cough, shortness of breath, headache, earache, sore throat, rash or extremity pain.  He states he has not seen a GI doctor and feels her pain has gotten better over the last couple months.  No other acute concerns at this time         Past Medical History:  Diagnosis Date   Anxiety    GERD (gastroesophageal reflux disease)    Hypertension     There are no problems to display for this patient.   Past Surgical History:  Procedure Laterality Date   WISDOM TOOTH EXTRACTION      Prior to Admission medications   Medication Sig Start Date End Date Taking? Authorizing Provider  sucralfate (CARAFATE) 1 g tablet Take 1 tablet (1 g total) by mouth 4 (four) times daily -  with meals and at bedtime for 7 days. 06/11/21 06/18/21 Yes Gilles Chiquito, MD  diazepam (VALIUM) 2 MG tablet TAKE 1 TABLET (2 MG TOTAL) BY MOUTH EVERY 8 (EIGHT) HOURS AS NEEDED FOR ANXIETY 02/23/19   [provider]  famotidine (PEPCID) 40 MG tablet Take 40 mg by mouth 2 (two) times daily. 03/26/19 03/25/20  [provider]  hydrochlorothiazide (HYDRODIURIL) 12.5 MG tablet Take 12.5 mg by mouth daily. 07/04/18 07/04/19  [provider]  omeprazole (PRILOSEC) 20 MG capsule Take 20 mg by mouth 2 (two) times daily. 07/04/18 07/04/19  [provider]    Allergies Bupropion and Flagyl [metronidazole]  History reviewed. No pertinent family history.  Social History Social History   Tobacco Use   Smoking status: Every Day    Packs/day: 0.50    Types: Cigarettes   Smokeless tobacco: Never  Substance Use Topics   Alcohol use: No   Drug use: No    Review of Systems  Review of Systems  Constitutional:  Positive for weight loss. Negative for chills and fever.  HENT:  Negative for sore throat.   Eyes:  Negative for pain.  Respiratory:  Negative for cough and stridor.   Cardiovascular:  Negative for chest pain.  Gastrointestinal:  Positive for abdominal pain, constipation, diarrhea, nausea and vomiting.  Genitourinary:  Negative for dysuria.  Musculoskeletal:  Negative for myalgias.  Skin:  Negative for rash.  Neurological:  Negative for seizures, loss of consciousness and headaches.  Psychiatric/Behavioral:  Negative for suicidal ideas.   All other systems reviewed and are negative.    ____________________________________________   PHYSICAL EXAM:  VITAL SIGNS: ED Triage Vitals  Enc Vitals Group     BP 06/11/21 1350 (!) 142/91     Pulse Rate 06/11/21 0910 90     Resp  06/11/21 0910 20     Temp 06/11/21 0910 99.1 F (37.3 C)     Temp Source 06/11/21 0910 Oral     SpO2 06/11/21 0910 100 %     Weight 06/11/21 0913 205 lb (93 kg)     Height 06/11/21 0910 5' (1.524 m)     Head Circumference --      Peak Flow --      Pain Score 06/11/21 0913 6     Pain Loc --      Pain Edu? --      Excl. in GC? --    Vitals:   06/11/21 0910 06/11/21 1350  BP:  (!) 142/91  Pulse: 90 81  Resp: 20 16  Temp: 99.1 F (37.3 C)   SpO2: 100% 100%   Physical Exam Vitals and nursing  note reviewed.  Constitutional:      General: She is not in acute distress.    Appearance: She is well-developed.  HENT:     Head: Normocephalic and atraumatic.     Right Ear: External ear normal.     Left Ear: External ear normal.     Nose: Nose normal.  Eyes:     Conjunctiva/sclera: Conjunctivae normal.  Cardiovascular:     Rate and Rhythm: Normal rate and regular rhythm.     Heart sounds: No murmur heard. Pulmonary:     Effort: Pulmonary effort is normal. No respiratory distress.     Breath sounds: Normal breath sounds.  Abdominal:     Palpations: Abdomen is soft.     Tenderness: There is abdominal tenderness in the epigastric area. There is no right CVA tenderness or left CVA tenderness.  Musculoskeletal:     Cervical back: Neck supple.  Skin:    General: Skin is warm and dry.     Capillary Refill: Capillary refill takes less than 2 seconds.  Neurological:     Mental Status: She is alert and oriented to person, place, and time.  Psychiatric:        Mood and Affect: Mood normal.     ____________________________________________   LABS (all labs ordered are listed, but only abnormal results are displayed)  Labs Reviewed  COMPREHENSIVE METABOLIC PANEL - Abnormal; Notable for the following components:      Result Value   Glucose, Bld 108 (*)    All other components within normal limits  CBC - Abnormal; Notable for the following components:   RBC 5.16 (*)    MCV 73.4 (*)    MCH 23.3 (*)    All other components within normal limits  URINALYSIS, COMPLETE (UACMP) WITH MICROSCOPIC - Abnormal; Notable for the following components:   Color, Urine YELLOW (*)    APPearance HAZY (*)    All other components within normal limits  LIPASE, BLOOD  POC URINE PREG, ED   ____________________________________________  EKG  ____________________________________________  RADIOLOGY  ED MD interpretation:    Official radiology report(s): No results  found.  ____________________________________________   PROCEDURES  Procedure(s) performed (including Critical Care):  Procedures   ____________________________________________   INITIAL IMPRESSION / ASSESSMENT AND PLAN / ED COURSE      Patient presents with above-stated exam for assessment of several months of ongoing abdominal pain seemingly worse in the last couple weeks associate with intermittent vomiting and diarrhea alternating with constipation.  She has been taking over-the-counter GERD medicines which she states have not helped.  On arrival she is afebrile hemodynamically stable.  She does have some mild  tenderness in epigastrium.  CMP shows no significant electrode or metabolic derangements.  No evidence of hepatitis or cholestasis.  Lipase not consistent with acute pancreatitis.  CBC without leukocytosis or acute anemia.  UA unremarkable.  Pregnancy test is negative.   Differential includes peptic ulcer disease, gastritis, inflammatory bowel disease possible malignancy.  Overall duration of symptoms and description as well as absence of fever or tenderness outside of epigastrium with reassuring labs is not consistent with appendicitis, diverticulitis, acute cholecystitis, pancreatitis, pyelonephritis, cystitis or other immediate life-threatening pathology.  Advised patient I recommend she follow-up with GI for further evaluation and management.  We will trial short course of Carafate as she has not tried this.  However given duration of symptoms with stable vitals and reassuring labs and exam I do not believe further emergent investigations or inpatient mission is currently required.  Will give referral information for GI.  Discharged condition.  Strict return precautions advised and discussed.          ____________________________________________   FINAL CLINICAL IMPRESSION(S) / ED DIAGNOSES  Final diagnoses:  Epigastric pain    Medications - No data to  display   ED Discharge Orders          Ordered    sucralfate (CARAFATE) 1 g tablet  3 times daily with meals & bedtime        06/11/21 1407             Note:  This document was prepared using Dragon voice recognition software and may include unintentional dictation errors.    Gilles Chiquito, MD 06/11/21 [phone]

## 2021-10-16 ENCOUNTER — Encounter: Payer: Self-pay | Admitting: *Deleted

## 2021-10-17 ENCOUNTER — Encounter: Payer: Self-pay | Admitting: *Deleted

## 2021-10-17 ENCOUNTER — Ambulatory Visit
Admission: RE | Admit: 2021-10-17 | Discharge: 2021-10-17 | Disposition: A | Discharge status: Home / Self Care | Payer: Medicaid Other | Attending: Gastroenterology | Admitting: Gastroenterology

## 2021-10-17 ENCOUNTER — Ambulatory Visit: Payer: Medicaid Other | Admitting: Anesthesiology

## 2021-10-17 ENCOUNTER — Encounter
Admission: RE | Disposition: A | Discharge status: Home / Self Care | Payer: Self-pay | Source: Home / Self Care | Attending: Gastroenterology

## 2021-10-17 DIAGNOSIS — K2950 Unspecified chronic gastritis without bleeding: Secondary | ICD-10-CM | POA: Diagnosis not present

## 2021-10-17 DIAGNOSIS — K529 Noninfective gastroenteritis and colitis, unspecified: Secondary | ICD-10-CM | POA: Insufficient documentation

## 2021-10-17 DIAGNOSIS — I10 Essential (primary) hypertension: Secondary | ICD-10-CM | POA: Diagnosis not present

## 2021-10-17 DIAGNOSIS — D509 Iron deficiency anemia, unspecified: Secondary | ICD-10-CM | POA: Insufficient documentation

## 2021-10-17 DIAGNOSIS — K5900 Constipation, unspecified: Secondary | ICD-10-CM | POA: Insufficient documentation

## 2021-10-17 DIAGNOSIS — K31A11 Gastric intestinal metaplasia without dysplasia, involving the antrum: Secondary | ICD-10-CM | POA: Diagnosis not present

## 2021-10-17 DIAGNOSIS — K219 Gastro-esophageal reflux disease without esophagitis: Secondary | ICD-10-CM | POA: Diagnosis not present

## 2021-10-17 DIAGNOSIS — R1310 Dysphagia, unspecified: Secondary | ICD-10-CM | POA: Insufficient documentation

## 2021-10-17 HISTORY — PX: ESOPHAGOGASTRODUODENOSCOPY (EGD) WITH PROPOFOL: SHX5813

## 2021-10-17 HISTORY — PX: COLONOSCOPY: SHX5424

## 2021-10-17 HISTORY — DX: Anemia, unspecified: D64.9

## 2021-10-17 HISTORY — DX: Polycystic ovarian syndrome: E28.2

## 2021-10-17 LAB — POCT PREGNANCY, URINE: Preg Test, Ur: NEGATIVE

## 2021-10-17 SURGERY — COLONOSCOPY
Anesthesia: General

## 2021-10-17 MED ORDER — STERILE WATER FOR IRRIGATION IR SOLN
Status: DC | PRN
  Administered 2021-10-17 (×2): 60 mL

## 2021-10-17 MED ORDER — PHENYLEPHRINE HCL (PRESSORS) 10 MG/ML IV SOLN
INTRAVENOUS | Status: DC | PRN
  Administered 2021-10-17 (×2): 100 ug via INTRAVENOUS

## 2021-10-17 MED ORDER — GLYCOPYRROLATE 0.2 MG/ML IJ SOLN
INTRAMUSCULAR | Status: DC | PRN
  Administered 2021-10-17: .2 mg via INTRAVENOUS

## 2021-10-17 MED ORDER — PROPOFOL 10 MG/ML IV BOLUS
INTRAVENOUS | Status: DC | PRN
  Administered 2021-10-17: 70 mg via INTRAVENOUS

## 2021-10-17 MED ORDER — PROPOFOL 500 MG/50ML IV EMUL
INTRAVENOUS | Status: AC
  Filled 2021-10-17: qty 50

## 2021-10-17 MED ORDER — SODIUM CHLORIDE 0.9 % IV SOLN: INTRAVENOUS | Status: DC

## 2021-10-17 MED ORDER — PROPOFOL 500 MG/50ML IV EMUL
INTRAVENOUS | Status: DC | PRN
  Administered 2021-10-17: 165 ug/kg/min via INTRAVENOUS

## 2021-10-17 MED ORDER — DEXMEDETOMIDINE (PRECEDEX) IN NS 20 MCG/5ML (4 MCG/ML) IV SYRINGE
PREFILLED_SYRINGE | INTRAVENOUS | Status: DC | PRN
  Administered 2021-10-17: 20 ug via INTRAVENOUS

## 2021-10-17 NOTE — Anesthesia Preprocedure Evaluation (Signed)
Anesthesia Evaluation  Patient identified by MRN, date of birth, ID band Patient awake    Reviewed: Allergy & Precautions, H&P , NPO status , Patient's Chart, lab work & pertinent test results, reviewed documented beta blocker date and time   Airway Mallampati: II   Neck ROM: full    Dental  (+) Poor Dentition, Teeth Intact   Pulmonary neg pulmonary ROS, Current Smoker,    Pulmonary exam normal        Cardiovascular Exercise Tolerance: Good hypertension, On Medications negative cardio ROS Normal cardiovascular exam Rhythm:regular Rate:Normal     Neuro/Psych Anxiety negative neurological ROS  negative psych ROS   GI/Hepatic Neg liver ROS, GERD  Medicated,  Endo/Other  negative endocrine ROS  Renal/GU negative Renal ROS  negative genitourinary   Musculoskeletal   Abdominal   Peds  Hematology  (+) Blood dyscrasia, anemia ,   Anesthesia Other Findings Past Medical History: No date: Anemia No date: Anxiety No date: GERD (gastroesophageal reflux disease) No date: Hypertension No date: PCOS (polycystic ovarian syndrome) Past Surgical History: No date: WISDOM TOOTH EXTRACTION   Reproductive/Obstetrics negative OB ROS                             Anesthesia Physical Anesthesia Plan  ASA: 3  Anesthesia Plan: General   Post-op Pain Management:    Induction:   PONV Risk Score and Plan:   Airway Management Planned:   Additional Equipment:   Intra-op Plan:   Post-operative Plan:   Informed Consent: I have reviewed the patients History and Physical, chart, labs and discussed the procedure including the risks, benefits and alternatives for the proposed anesthesia with the patient or authorized representative who has indicated his/her understanding and acceptance.     Dental Advisory Given  Plan Discussed with: CRNA  Anesthesia Plan Comments:         Anesthesia Quick  Evaluation

## 2021-10-17 NOTE — Op Note (Signed)
[facility]astroenter[facility] Patient Name: Deborah Bradford Procedure Date: 10/17/2021 10:20 AM MRN: [id number] Account #: [id number] Date of Birth: 02-Jul-1981 Admit Type: Outpatient Age: 40 Room: [facility] ENDO ROOM 1 Gender: Female Note Status: Finalized Instrument Name: Jasper Riling [id number] Procedure:             Colonoscopy Indications:           Abdominal pain in the left lower quadrant,                         Constipation, Chronic diarrhea Providers:             Andrey Farmer MD, MD Referring MD:          [facility] [place] (Referring MD) Medicines:             Monitored Anesthesia Care Complications:         No immediate complications. Procedure:             Pre-Anesthesia Assessment:                        - Prior to the procedure, a History and Physical was                         performed, and patient medications and allergies were                         reviewed. The patient is competent. The risks and                         benefits of the procedure and the sedation options and                         risks were discussed with the patient. All questions                         were answered and informed consent was obtained.                         Patient identification and proposed procedure were                         verified by the physician, the nurse, the anesthetist                         and the technician in the endoscopy suite. Mental                         Status Examination: alert and oriented. Airway                         Examination: normal oropharyngeal airway and neck                         mobility. Respiratory Examination: clear to                         auscultation. CV Examination: normal. Prophylactic  Antibiotics: The patient does not require prophylactic                         antibiotics. Prior Anticoagulants: The patient has                         taken no previous anticoagulant or  antiplatelet                         agents. ASA Grade Assessment: III - A patient with                         severe systemic disease. After reviewing the risks and                         benefits, the patient was deemed in satisfactory                         condition to undergo the procedure. The anesthesia                         plan was to use monitored anesthesia care (MAC).                         Immediately prior to administration of medications,                         the patient was re-assessed for adequacy to receive                         sedatives. The heart rate, respiratory rate, oxygen                         saturations, blood pressure, adequacy of pulmonary                         ventilation, and response to care were monitored                         throughout the procedure. The physical status of the                         patient was re-assessed after the procedure.                        After obtaining informed consent, the colonoscope was                         passed under direct vision. Throughout the procedure,                         the patient's blood pressure, pulse, and oxygen                         saturations were monitored continuously. The                         Colonoscope was introduced through the anus and  advanced to the the terminal ileum. The colonoscopy                         was performed without difficulty. The patient                         tolerated the procedure well. The quality of the bowel                         preparation was good. Findings:      The perianal and digital rectal examinations were normal.      The terminal ileum appeared normal.      The entire examined colon appeared normal on direct and retroflexion       views. Impression:            - The examined portion of the ileum was normal.                        - The entire examined colon is normal on direct and                          retroflexion views.                        - No specimens collected. Recommendation:        - Discharge patient to home.                        - Resume previous diet.                        - Continue present medications.                        - Repeat colonoscopy in 10 years for screening                         purposes.                        - Return to referring physician as previously                         scheduled. Procedure Code(s):     --- Professional ---                        [phone], Colonoscopy, flexible; diagnostic, including                         collection of specimen(s) by brushing or washing, when                         performed (separate procedure) Diagnosis Code(s):     --- Professional ---                        R10.32, Left lower quadrant pain                        K59.00, Constipation, unspecified  K52.9, Noninfective gastroenteritis and colitis,                         unspecified CPT copyright 2019 American Medical Association. All rights reserved. The codes documented in this report are preliminary and upon coder review may  be revised to meet current compliance requirements. Andrey Farmer MD, MD 10/17/2021 11:05:52 AM Number of Addenda: 0 Note Initiated On: 10/17/2021 10:20 AM Scope Withdrawal Time: 0 hours 6 minutes 5 seconds  Total Procedure Duration: 0 hours 14 minutes 8 seconds  Estimated Blood Loss:  Estimated blood loss: none.      [facility]

## 2021-10-17 NOTE — Op Note (Signed)
[facility]astroenter[facility] Patient Name: Deborah Bradford Procedure Date: 10/17/2021 10:20 AM MRN: [id number] Account #: [id number] Date of Birth: 24-Aug-1981 Admit Type: Outpatient Age: 40 Room: [facility] ENDO ROOM 1 Gender: Female Note Status: Finalized Instrument Name: Upper Endoscope [id number] Procedure:             Upper GI endoscopy Indications:           Iron deficiency anemia, Dysphagia, Gastro-esophageal                         reflux disease Providers:             Andrey Farmer MD, MD Referring MD:          [facility] [place] (Referring MD) Medicines:             Monitored Anesthesia Care Complications:         No immediate complications. Estimated blood loss:                         Minimal. Procedure:             Pre-Anesthesia Assessment:                        - Prior to the procedure, a History and Physical was                         performed, and patient medications and allergies were                         reviewed. The patient is competent. The risks and                         benefits of the procedure and the sedation options and                         risks were discussed with the patient. All questions                         were answered and informed consent was obtained.                         Patient identification and proposed procedure were                         verified by the physician, the nurse, the anesthetist                         and the technician in the endoscopy suite. Mental                         Status Examination: alert and oriented. Airway                         Examination: normal oropharyngeal airway and neck                         mobility. Respiratory Examination: clear to  auscultation. CV Examination: normal. Prophylactic                         Antibiotics: The patient does not require prophylactic                         antibiotics. Prior Anticoagulants: The patient has                          taken no previous anticoagulant or antiplatelet                         agents. ASA Grade Assessment: III - A patient with                         severe systemic disease. After reviewing the risks and                         benefits, the patient was deemed in satisfactory                         condition to undergo the procedure. The anesthesia                         plan was to use monitored anesthesia care (MAC).                         Immediately prior to administration of medications,                         the patient was re-assessed for adequacy to receive                         sedatives. The heart rate, respiratory rate, oxygen                         saturations, blood pressure, adequacy of pulmonary                         ventilation, and response to care were monitored                         throughout the procedure. The physical status of the                         patient was re-assessed after the procedure.                        After obtaining informed consent, the endoscope was                         passed under direct vision. Throughout the procedure,                         the patient's blood pressure, pulse, and oxygen                         saturations were monitored continuously. The Endoscope  was introduced through the mouth, and advanced to the                         second part of duodenum. The upper GI endoscopy was                         accomplished without difficulty. The patient tolerated                         the procedure well. Findings:      The examined esophagus was normal. Biopsies were obtained from the       proximal and distal esophagus with cold forceps for histology of       suspected eosinophilic esophagitis. Estimated blood loss was minimal.      Patchy minimal inflammation characterized by erythema was found in the       gastric antrum. Biopsies were taken with a cold forceps for  Helicobacter       pylori testing. Estimated blood loss was minimal.      The examined duodenum was normal. Impression:            - Normal esophagus. Biopsied.                        - Gastritis. Biopsied.                        - Normal examined duodenum. Recommendation:        - Await pathology results.                        - Perform a colonoscopy today. Procedure Code(s):     --- Professional ---                        [phone], Esophagogastroduodenoscopy, flexible,                         transoral; with biopsy, single or multiple Diagnosis Code(s):     --- Professional ---                        K29.70, Gastritis, unspecified, without bleeding                        D50.9, Iron deficiency anemia, unspecified                        R13.10, Dysphagia, unspecified                        K21.9, Gastro-esophageal reflux disease without                         esophagitis CPT copyright 2019 American Medical Association. All rights reserved. The codes documented in this report are preliminary and upon coder review may  be revised to meet current compliance requirements. Andrey Farmer MD, MD 10/17/2021 11:03:48 AM Number of Addenda: 0 Note Initiated On: 10/17/2021 10:20 AM Estimated Blood Loss:  Estimated blood loss was minimal.      [facility]

## 2021-10-17 NOTE — Transfer of Care (Signed)
Immediate Anesthesia Transfer of Care Note  Patient: Deborah Bradford  Procedure(s) Performed: COLONOSCOPY ESOPHAGOGASTRODUODENOSCOPY (EGD) WITH PROPOFOL  Patient Location: PACU  Anesthesia Type:General  Level of Consciousness: awake and alert   Airway & Oxygen Therapy: Patient Spontanous Breathing and Patient connected to nasal cannula oxygen  Post-op Assessment: Report given to RN and Post -op Vital signs reviewed and stable  Post vital signs: Reviewed and stable  Last Vitals:  Vitals Value Taken Time  BP 104/57 10/17/21 1104  Temp 36.1 C 10/17/21 1103  Pulse 83 10/17/21 1104  Resp 24 10/17/21 1104  SpO2 95 % 10/17/21 1104  Vitals shown include unvalidated device data.  Last Pain:  Vitals:   10/17/21 1103  TempSrc: Temporal  PainSc: Asleep         Complications: No notable events documented.

## 2021-10-17 NOTE — Interval H&P Note (Signed)
History and Physical Interval Note:  10/17/2021 10:21 AM  Deborah Bradford  has presented today for surgery, with the diagnosis of Gastroesophageal reflux disease, unspecified whether esophagitis present (K21.9) Pharyngoesophageal dysphagia (R13.14) Iron deficiency anemia.  The various methods of treatment have been discussed with the patient and family. After consideration of risks, benefits and other options for treatment, the patient has consented to  Procedure(s): COLONOSCOPY (N/A) ESOPHAGOGASTRODUODENOSCOPY (EGD) WITH PROPOFOL (N/A) as a surgical intervention.  The patient's history has been reviewed, patient examined, no change in status, stable for surgery.  I have reviewed the patient's chart and labs.  Questions were answered to the patient's satisfaction.     Regis Bill  Ok to proceed with EGD/Colonoscopy

## 2021-10-17 NOTE — H&P (Signed)
Outpatient short stay form Pre-procedure 10/17/2021  Regis Bill, MD  Primary Physician: [facility]  Reason for visit:  Dysphagia/Dyspepsia/Constipation/Diarrhea/IDA  History of present illness:   40 y/o lady with history of hypertension who presents for EGD/Colonoscopy for a variety of symptoms including dysphagia/dyspepsia/constipation/diarrhea. No blood thinners. No family history of GI malignancies. No significant abdominal surgeries.    Current Facility-Administered Medications:    0.9 %  sodium chloride infusion, , Intravenous, Continuous, Cailah Reach, Rossie Muskrat, MD, Last Rate: 20 mL/hr at 10/17/21 1017, New Bag at 10/17/21 1017  Medications Prior to Admission  Medication Sig Dispense Refill Last Dose   diazepam (VALIUM) 2 MG tablet TAKE 1 TABLET (2 MG TOTAL) BY MOUTH EVERY 8 (EIGHT) HOURS AS NEEDED FOR ANXIETY   Past Week   omeprazole (PRILOSEC) 20 MG capsule Take 20 mg by mouth 2 (two) times daily.   Past Week   sucralfate (CARAFATE) 1 g tablet Take 1 tablet (1 g total) by mouth 4 (four) times daily -  with meals and at bedtime for 7 days. 28 tablet 0 Past Week   famotidine (PEPCID) 40 MG tablet Take 40 mg by mouth 2 (two) times daily.      hydrochlorothiazide (HYDRODIURIL) 12.5 MG tablet Take 12.5 mg by mouth daily.        Allergies  Allergen Reactions   Bupropion Anxiety   Flagyl [Metronidazole]      Past Medical History:  Diagnosis Date   Anemia    Anxiety    GERD (gastroesophageal reflux disease)    Hypertension    PCOS (polycystic ovarian syndrome)     Review of systems:  Otherwise negative.    Physical Exam  Gen: Alert, oriented. Appears stated age.  HEENT: PERRLA. Lungs: No respiratory distress CV: RRR Abd: soft, benign, no masses Ext: No edema    Planned procedures: Proceed with EGD/colonoscopy. The patient understands the nature of the planned procedure, indications, risks, alternatives and potential complications including  but not limited to bleeding, infection, perforation, damage to internal organs and possible oversedation/side effects from anesthesia. The patient agrees and gives consent to proceed.  Please refer to procedure notes for findings, recommendations and patient disposition/instructions.     Regis Bill, MD [facility]

## 2021-10-17 NOTE — Anesthesia Postprocedure Evaluation (Signed)
Anesthesia Post Note  Patient: Marketing executive  Procedure(s) Performed: COLONOSCOPY ESOPHAGOGASTRODUODENOSCOPY (EGD) WITH PROPOFOL  Patient location during evaluation: PACU Anesthesia Type: General Level of consciousness: awake and alert, oriented and patient cooperative Pain management: pain level controlled Vital Signs Assessment: post-procedure vital signs reviewed and stable Respiratory status: spontaneous breathing, nonlabored ventilation and respiratory function stable Cardiovascular status: blood pressure returned to baseline and stable Postop Assessment: adequate PO intake Anesthetic complications: no   No notable events documented.   Last Vitals:  Vitals:   10/17/21 1123 10/17/21 1133  BP: 97/74 111/73  Pulse:    Resp:    Temp:    SpO2:      Last Pain:  Vitals:   10/17/21 1133  TempSrc:   PainSc: 0-No pain                 Reed Breech

## 2021-10-18 ENCOUNTER — Encounter: Payer: Self-pay | Admitting: Gastroenterology

## 2021-10-18 LAB — SURGICAL PATHOLOGY

## 2022-10-08 ENCOUNTER — Encounter: Payer: Medicaid Other | Admitting: Obstetrics

## 2023-06-29 ENCOUNTER — Other Ambulatory Visit: Payer: Self-pay

## 2023-06-29 ENCOUNTER — Emergency Department
Admission: EM | Admit: 2023-06-29 | Discharge: 2023-06-29 | Disposition: A | Discharge status: Home / Self Care | Payer: MEDICAID | Attending: Emergency Medicine | Admitting: Emergency Medicine

## 2023-06-29 DIAGNOSIS — K0889 Other specified disorders of teeth and supporting structures: Secondary | ICD-10-CM | POA: Diagnosis present

## 2023-06-29 DIAGNOSIS — I10 Essential (primary) hypertension: Secondary | ICD-10-CM | POA: Insufficient documentation

## 2023-06-29 MED ORDER — AMOXICILLIN-POT CLAVULANATE 875-125 MG PO TABS
1.0000 | ORAL_TABLET | Freq: Two times a day (BID) | ORAL | 0 refills | Status: AC
Start: 2023-06-29 — End: 2023-07-06

## 2023-06-29 MED ORDER — CHLORHEXIDINE GLUCONATE 0.12 % MT SOLN: 15.0000 mL | Freq: Two times a day (BID) | OROMUCOSAL | 0 refills | Status: DC

## 2023-06-29 NOTE — Discharge Instructions (Signed)
Take the antibiotics as prescribed.  Please follow-up with a dentist as soon as you can.  Please return for any new, worsening, or change in symptoms or other concerns.  It was a pleasure caring for you today.

## 2023-06-29 NOTE — ED Provider Notes (Signed)
[facility] Provider Note    Event Date/Time   First MD Initiated Contact with Patient 06/29/23 [phone]     (approximate)   History   Dental Pain   HPI  Deborah Bradford is a 42 y.o. female with a past medical history of anxiety, anemia, hypertension, PCOS who presents today for evaluation of left upper dental pain.  Patient reports that she has not yet been able to see a dentist.  She reports that she recently changed jobs and is waiting for her insurance to become established which she thinks will be within the next week or so.  She reports that she has pain to her left upper posterior molar only.  She has not had any trouble eating or drinking.  She has not had any trouble swallowing.  No sublingual swelling.  No neck pain or stiffness.  No fevers or chills.  There are no problems to display for this patient.         Physical Exam   Triage Vital Signs: ED Triage Vitals  Encounter Vitals Group     BP 06/29/23 0746 (!) 153/97     Systolic BP Percentile --      Diastolic BP Percentile --      Pulse Rate 06/29/23 0746 70     Resp 06/29/23 0746 16     Temp 06/29/23 0746 98.7 F (37.1 C)     Temp Source 06/29/23 0746 Oral     SpO2 06/29/23 0746 100 %     Weight 06/29/23 0744 218 lb 14.7 oz (99.3 kg)     Height 06/29/23 0744 5\' 2"  (1.575 m)     Head Circumference --      Peak Flow --      Pain Score 06/29/23 0744 0     Pain Loc --      Pain Education --      Exclude from Growth Chart --     Most recent vital signs: Vitals:   06/29/23 0746  BP: (!) 153/97  Pulse: 70  Resp: 16  Temp: 98.7 F (37.1 C)  SpO2: 100%    Physical Exam Vitals and nursing note reviewed.  Constitutional:      General: Awake and alert. No acute distress.    Appearance: Normal appearance. The patient is normal weight.  HENT:     Head: Normocephalic and atraumatic.     Mouth: Mucous membranes are moist.  Normal-appearing dentition, though tap tenderness to tooth #18.   No gingival fluctuance.  No sublingual swelling.  No drooling.  No facial or neck swelling or erythema.  No voice change. Eyes:     General: PERRL. Normal EOMs        Right eye: No discharge.        Left eye: No discharge.     Conjunctiva/sclera: Conjunctivae normal.  Cardiovascular:     Rate and Rhythm: Normal rate and regular rhythm.     Pulses: Normal pulses.  Pulmonary:     Effort: Pulmonary effort is normal. No respiratory distress.     Breath sounds: Normal breath sounds.  Abdominal:     Abdomen is soft. There is no abdominal tenderness. No rebound or guarding. No distention. Musculoskeletal:        General: No swelling. Normal range of motion.     Cervical back: Normal range of motion and neck supple.  Skin:    General: Skin is warm and dry.     Capillary Refill: Capillary  refill takes less than 2 seconds.     Findings: No rash.  Neurological:     Mental Status: The patient is awake and alert.      ED Results / Procedures / Treatments   Labs (all labs ordered are listed, but only abnormal results are displayed) Labs Reviewed - No data to display   EKG     RADIOLOGY     PROCEDURES:  Critical Care performed:   Procedures   MEDICATIONS ORDERED IN ED: Medications - No data to display   IMPRESSION / MDM / ASSESSMENT AND PLAN / ED COURSE  I reviewed the triage vital signs and the nursing notes.   Differential diagnosis includes, but is not limited to, pulpitis, dental caries, decay.  Patient was evaluated in the emergency department for dental pain.  She is awake and alert, hemodynamically stable and afebrile.  Patient has tenderness over 1 of her teeth, I suspect some dental caries vs pulpitits. No gingival swelling or fluctuance concerning for gingival abscess.  No trismus, nuchal rigidity, neck pain, hot potato voice, uvular deviation or malocclusion to suggest deep space infection. No sublingual swelling concerning for Ludwig's angina.  Patient was  started on antibiotics and chlorhexidine mouth rinse.  Patient declined analgesia.. Discussed care plan, return precautions, and advised close outpatient follow-up with dentist. Patient agrees with plan of care.   Patient's presentation is most consistent with acute complicated illness / injury requiring diagnostic workup.     FINAL CLINICAL IMPRESSION(S) / ED DIAGNOSES   Final diagnoses:  Pain, dental     Rx / DC Orders   ED Discharge Orders          Ordered    amoxicillin-clavulanate (AUGMENTIN) 875-125 MG tablet  2 times daily        06/29/23 0758    chlorhexidine (PERIDEX) 0.12 % solution  2 times daily        06/29/23 0758             Note:  This document was prepared using Dragon voice recognition software and may include unintentional dictation errors.   Jackelyn Hoehn, PA-C 06/29/23 1113    Sharyn Creamer, MD 06/29/23 1121

## 2023-06-29 NOTE — ED Triage Notes (Addendum)
Left upper jaw dental pain.  History of a broken tooth about 2 months ago.  Had some antibiotics at home and had taken them, which had initially improved symptoms, but swelling has returned.

## 2023-09-09 ENCOUNTER — Other Ambulatory Visit: Payer: Self-pay

## 2023-09-09 ENCOUNTER — Encounter (HOSPITAL_BASED_OUTPATIENT_CLINIC_OR_DEPARTMENT_OTHER): Payer: Self-pay | Admitting: Emergency Medicine

## 2023-09-09 DIAGNOSIS — Z5329 Procedure and treatment not carried out because of patient's decision for other reasons: Secondary | ICD-10-CM | POA: Diagnosis not present

## 2023-09-09 DIAGNOSIS — R519 Headache, unspecified: Secondary | ICD-10-CM | POA: Diagnosis present

## 2023-09-09 LAB — BASIC METABOLIC PANEL
Anion gap: 14 (ref 5–15)
BUN: 12 mg/dL (ref 6–20)
CO2: 21 mmol/L — ABNORMAL LOW (ref 22–32)
Calcium: 8.9 mg/dL (ref 8.9–10.3)
Chloride: 100 mmol/L (ref 98–111)
Creatinine, Ser: 0.79 mg/dL (ref 0.44–1.00)
GFR, Estimated: >60 mL/min (ref >60)
Glucose, Bld: 103 mg/dL — ABNORMAL HIGH (ref 70–99)
Potassium: 3.6 mmol/L (ref 3.5–5.1)
Sodium: 135 mmol/L (ref 135–145)

## 2023-09-09 LAB — CBC
HCT: 36.2 % (ref 36.0–46.0)
Hemoglobin: 11.6 g/dL — ABNORMAL LOW (ref 12.0–15.0)
MCH: 24.9 pg — ABNORMAL LOW (ref 26.0–34.0)
MCHC: 32 g/dL (ref 30.0–36.0)
MCV: 77.7 fL — ABNORMAL LOW (ref 80.0–100.0)
Platelets: 209 10*3/uL (ref 150–400)
RBC: 4.66 MIL/uL (ref 3.87–5.11)
RDW: 13.7 % (ref 11.5–15.5)
WBC: 6.6 10*3/uL (ref 4.0–10.5)
nRBC: 0 % (ref 0.0–0.2)

## 2023-09-09 LAB — CBG MONITORING, ED: Glucose-Capillary: 114 mg/dL — ABNORMAL HIGH (ref 70–99)

## 2023-09-09 NOTE — ED Triage Notes (Signed)
Pt reports checking BP throughout work and having BPs in the 150s/100s, reports having a HA, blurry vision, CP, heartburn, and polyuria  Denies ShoB, n/v, diaphoresis,

## 2023-09-10 ENCOUNTER — Emergency Department (HOSPITAL_BASED_OUTPATIENT_CLINIC_OR_DEPARTMENT_OTHER)
Admission: EM | Admit: 2023-09-10 | Discharge: 2023-09-10 | Discharge status: Against Medical Advice | Payer: MEDICAID | Attending: Emergency Medicine | Admitting: Emergency Medicine

## 2023-09-10 ENCOUNTER — Emergency Department (HOSPITAL_BASED_OUTPATIENT_CLINIC_OR_DEPARTMENT_OTHER): Payer: MEDICAID

## 2023-09-10 DIAGNOSIS — R519 Headache, unspecified: Secondary | ICD-10-CM

## 2023-09-10 LAB — TROPONIN I (HIGH SENSITIVITY)
Troponin I (High Sensitivity): <2 ng/L (ref <18)
Troponin I (High Sensitivity): <2 ng/L (ref <18)

## 2023-09-10 MED ORDER — ALUM & MAG HYDROXIDE-SIMETH 200-200-20 MG/5ML PO SUSP
30.0000 mL | Freq: Once | ORAL | Status: DC
  Filled 2023-09-10: qty 30

## 2023-09-10 MED ORDER — ACETAMINOPHEN 500 MG PO TABS
1000.0000 mg | ORAL_TABLET | Freq: Once | ORAL | Status: DC
  Filled 2023-09-10: qty 2

## 2023-09-10 NOTE — ED Provider Notes (Addendum)
Patient has been yelling and aggressive since arrival.  She has yelled at the EDP and then stated she is " not refusing care."  She alternated between saying she has hypertension and was on medications for years and then stating with nurse present.  Then she states she has never had elevated blood pressure readings in the past and this was all new tonight at work.  She then states she saw a doctor recently for frequent urination and had an elevated blood pressure at the office but nothing was done.  EDP politely tried to have patient relax in the bed so we could get an accurate blood pressure reading as patient just got back to the room and blood pressure is best measured when patient has been at rest for 5-10 minutes.  Patient continues to get in and out of the bed.  EDP stated that the patient was concerned enough to come in to be seen and we were taking her concerns very seriously and that there were tests we needed to order to ensure that there wasn't an underlying cause for her pressure to be elevated.  EDP repeatedly asked what I could do to address her concerns and what questions I could answer.  I was in the room for over 15 minutes trying to address patient's concerns and she just stated she had none.  She then repeated she had extreme anxiety. When the patient did rest briefly the blood pressure when to 138 systolic.  I would not give blood pressure medication at that level as then the patient would become hypotensive.  Patient refused IV stating "I know you ain't going to use it for anything".  Patient then refused medication for headache.  EDP attempted to redirect and stat I planned on giving IV medications for the patient's headache and getting her pain under control would then help with her blood pressure as blood pressure can be up secondary to pain. She continued to argue with EDP with nurse present.   I had ordered a head CT and full labs were drawn in triage.  I personally escorted patient to  bathroom when she needed to urinate.    Patient then began being aggressive with a second nurse.  Yelling was loud enough that it could be heard throughout the department.  She stated she is done waiting, it was explained that there were delays with radiology and the patient stated this "is not my problem".  She continues to yell at and argue with staff.  She has left with results pending      Shironda Kain, MD 09/10/23 1610

## 2023-09-10 NOTE — ED Notes (Signed)
This charge nurse became involved when patient was heard yelling at another nurse.  Patient visibly angry that "staff wasn't doing anything about her blood pressure and no one had been in her room."  Patient assured that we were doing everything we could do to take care of her including a head CT that she initially refused.  Patient states "no one has checked my blood pressure."  Patient informed that her blood pressure has been checked and multiple staff members had rounded on her and found that she kept removing her monitoring equipment.  Patient continued to verbally berate this nurse and another nurse and stated that she wanted to leave "since we weren't doing anything to help her."  Patient asked if we would call her with her results.  Patient informed that if she leaves AMA, she would be assuming the risks for her health condition, and that she should stay for the EDP to go over her results with her.  Patient then proceeded to state "fuck this" and walked out with a steady gait without signing AMA paperwork.

## 2023-09-10 NOTE — ED Provider Notes (Signed)
[facility] Provider Note   CSN: [id number] Arrival date & time: 09/09/23  2258     History  Chief Complaint  Patient presents with   Hypertension    Darrian Holahan is a 42 y.o. female.  The history is provided by the patient.  Hypertension This is a recurrent problem. The problem occurs constantly. The problem has not changed since onset.Associated symptoms include headaches. Pertinent negatives include no chest pain, no abdominal pain and no shortness of breath. Nothing aggravates the symptoms. Nothing relieves the symptoms. She has tried nothing for the symptoms. The treatment provided no relief.  Patient with a history of hypertension who is not currently on medication presents with headache this evening at work and blood pressures of 160 systolic at work.       Home Medications Prior to Admission medications   Medication Sig Start Date End Date Taking? Authorizing Provider  chlorhexidine (PERIDEX) 0.12 % solution Use as directed 15 mLs in the mouth or throat 2 (two) times daily. 06/29/23   Poggi, Eileen Stanford E, PA-C  diazepam (VALIUM) 2 MG tablet TAKE 1 TABLET (2 MG TOTAL) BY MOUTH EVERY 8 (EIGHT) HOURS AS NEEDED FOR ANXIETY 02/23/19   [provider]  famotidine (PEPCID) 40 MG tablet Take 40 mg by mouth 2 (two) times daily. 03/26/19 03/25/20  [provider]  hydrochlorothiazide (HYDRODIURIL) 12.5 MG tablet Take 12.5 mg by mouth daily. 07/04/18 07/04/19  [provider]  omeprazole (PRILOSEC) 20 MG capsule Take 20 mg by mouth 2 (two) times daily. 07/04/18 10/17/21  [provider]  sucralfate (CARAFATE) 1 g tablet Take 1 tablet (1 g total) by mouth 4 (four) times daily -  with meals and at bedtime for 7 days. 06/11/21 10/17/21  Gilles Chiquito, MD      Allergies    Bupropion and Flagyl [metronidazole]    Review of Systems   Review of Systems  Constitutional:  Negative for fever.  Respiratory:  Negative for  shortness of breath.   Cardiovascular:  Negative for chest pain.  Gastrointestinal:  Negative for abdominal pain and vomiting.  Neurological:  Positive for headaches. Negative for weakness.  All other systems reviewed and are negative.   Physical Exam Updated Vital Signs BP (!) 152/103   Pulse 65   Temp 98 F (36.7 C) (Oral)   Resp 20   Ht 5\' 2"  (1.575 m)   Wt 80.3 kg   LMP 08/26/2023 (Approximate)   SpO2 100%   BMI 32.37 kg/m  Physical Exam Vitals and nursing note reviewed. Exam conducted with a chaperone present (patient's nurse was present for entirety of history and physical).  Constitutional:      General: She is not in acute distress.    Appearance: She is well-developed.  HENT:     Head: Normocephalic and atraumatic.     Nose: Nose normal.     Mouth/Throat:     Mouth: Mucous membranes are moist.  Eyes:     Pupils: Pupils are equal, round, and reactive to light.  Cardiovascular:     Rate and Rhythm: Normal rate and regular rhythm.     Pulses: Normal pulses.     Heart sounds: Normal heart sounds.  Pulmonary:     Effort: Pulmonary effort is normal. No respiratory distress.     Breath sounds: Normal breath sounds.  Abdominal:     General: Bowel sounds are normal. There is no distension.     Palpations: Abdomen  is soft.     Tenderness: There is no abdominal tenderness. There is no guarding or rebound.  Genitourinary:    Vagina: No vaginal discharge.  Musculoskeletal:        General: Normal range of motion.     Cervical back: Normal range of motion and neck supple.  Skin:    General: Skin is warm and dry.     Capillary Refill: Capillary refill takes less than 2 seconds.     Findings: No erythema or rash.  Neurological:     General: No focal deficit present.     Deep Tendon Reflexes: Reflexes normal.     ED Results / Procedures / Treatments   Labs (all labs ordered are listed, but only abnormal results are displayed) Results for orders placed or performed  during the hospital encounter of 09/10/23  Basic metabolic panel  Result Value Ref Range   Sodium 135 135 - 145 mmol/L   Potassium 3.6 3.5 - 5.1 mmol/L   Chloride 100 98 - 111 mmol/L   CO2 21 (L) 22 - 32 mmol/L   Glucose, Bld 103 (H) 70 - 99 mg/dL   BUN 12 6 - 20 mg/dL   Creatinine, Ser 1.47 0.44 - 1.00 mg/dL   Calcium 8.9 8.9 - 82.9 mg/dL   GFR, Estimated >56 >21 mL/min   Anion gap 14 5 - 15  CBC  Result Value Ref Range   WBC 6.6 4.0 - 10.5 K/uL   RBC 4.66 3.87 - 5.11 MIL/uL   Hemoglobin 11.6 (L) 12.0 - 15.0 g/dL   HCT 30.8 65.7 - 84.6 %   MCV 77.7 (L) 80.0 - 100.0 fL   MCH 24.9 (L) 26.0 - 34.0 pg   MCHC 32.0 30.0 - 36.0 g/dL   RDW 96.2 95.2 - 84.1 %   Platelets 209 150 - 400 K/uL   nRBC 0.0 0.0 - 0.2 %  CBG monitoring, ED  Result Value Ref Range   Glucose-Capillary 114 (H) 70 - 99 mg/dL   Comment 1 Notify RN   Troponin I (High Sensitivity)  Result Value Ref Range   Troponin I (High Sensitivity) <2 <18 ng/L  Troponin I (High Sensitivity)  Result Value Ref Range   Troponin I (High Sensitivity) <2 <18 ng/L   CT Head Wo Contrast  Result Date: 09/10/2023 CLINICAL DATA:  Headache and blurry vision EXAM: CT HEAD WITHOUT CONTRAST TECHNIQUE: Contiguous axial images were obtained from the base of the skull through the vertex without intravenous contrast. RADIATION DOSE REDUCTION: This exam was performed according to the departmental dose-optimization program which includes automated exposure control, adjustment of the mA and/or kV according to patient size and/or use of iterative reconstruction technique. COMPARISON:  None Available. FINDINGS: Brain: No mass,hemorrhage or extra-axial collection. Normal appearance of the parenchyma and CSF spaces. Vascular: No hyperdense vessel or unexpected vascular calcification. Skull: The visualized skull base, calvarium and extracranial soft tissues are normal. Sinuses/Orbits: No fluid levels or advanced mucosal thickening of the visualized  paranasal sinuses. No mastoid or middle ear effusion. Normal orbits. IMPRESSION: Normal head CT. Electronically Signed   By: Deatra Robinson M.D.   On: 09/10/2023 04:02   DG Chest Portable 1 View  Result Date: 09/10/2023 CLINICAL DATA:  Headache, blurry vision, and chest pain. EXAM: PORTABLE CHEST 1 VIEW COMPARISON:  04/12/2019. FINDINGS: The heart size and mediastinal contours are within normal limits. No consolidation, effusion, or pneumothorax. No acute osseous abnormality. IMPRESSION: No active disease. Electronically Signed   By: Vernona Rieger  Ladona Ridgel M.D.   On: 09/10/2023 03:38    EKG EKG Interpretation Date/Time:  Monday September 09 2023 23:15:32 EDT Ventricular Rate:  57 PR Interval:  144 QRS Duration:  72 QT Interval:  427 QTC Calculation: 416 R Axis:   54  Text Interpretation: Sinus rhythm Confirmed by Nicanor Alcon, Era Parr ([id number]) on 09/09/2023 11:17:03 PM  Radiology CT Head Wo Contrast  Result Date: 09/10/2023 CLINICAL DATA:  Headache and blurry vision EXAM: CT HEAD WITHOUT CONTRAST TECHNIQUE: Contiguous axial images were obtained from the base of the skull through the vertex without intravenous contrast. RADIATION DOSE REDUCTION: This exam was performed according to the departmental dose-optimization program which includes automated exposure control, adjustment of the mA and/or kV according to patient size and/or use of iterative reconstruction technique. COMPARISON:  None Available. FINDINGS: Brain: No mass,hemorrhage or extra-axial collection. Normal appearance of the parenchyma and CSF spaces. Vascular: No hyperdense vessel or unexpected vascular calcification. Skull: The visualized skull base, calvarium and extracranial soft tissues are normal. Sinuses/Orbits: No fluid levels or advanced mucosal thickening of the visualized paranasal sinuses. No mastoid or middle ear effusion. Normal orbits. IMPRESSION: Normal head CT. Electronically Signed   By: Deatra Robinson M.D.   On: 09/10/2023 04:02   DG  Chest Portable 1 View  Result Date: 09/10/2023 CLINICAL DATA:  Headache, blurry vision, and chest pain. EXAM: PORTABLE CHEST 1 VIEW COMPARISON:  04/12/2019. FINDINGS: The heart size and mediastinal contours are within normal limits. No consolidation, effusion, or pneumothorax. No acute osseous abnormality. IMPRESSION: No active disease. Electronically Signed   By: Thornell Sartorius M.D.   On: 09/10/2023 03:38    Procedures Procedures    Medications Ordered in ED Medications  alum & mag hydroxide-simeth (MAALOX/MYLANTA) 200-200-20 MG/5ML suspension 30 mL (30 mLs Oral Not Given 09/10/23 0148)  acetaminophen (TYLENOL) tablet 1,000 mg (1,000 mg Oral Not Given 09/10/23 0147)    ED Course/ Medical Decision Making/ A&P                                 Medical Decision Making Patient with hypertension presents with headache this evening   Amount and/or Complexity of Data Reviewed External Data Reviewed: notes.    Details: Previous notes reviewed  Labs: ordered.    Details: Normal sodium 135, normal potassium 3.6, normal creatinine 0.79. 2 normal troponins both < 2.  Normal white count 6.6 hemoglobin slight low 11.6, normal platelet count 209.   Radiology: ordered and independent interpretation performed.    Details: Normal chest Xray by me. Head CT without acute findings by me   ECG/medicine tests: ordered and independent interpretation performed. Decision-making details documented in ED Course.  Risk OTC drugs. Risk Details: CT head was pending. Patient's blood pressure at rest is 138 systolic.  At this level is is unsafe to start blood pressure medication as the patient may become hypotensive.  Informed by nurse that patient left against medical advice.      Final Clinical Impression(s) / ED Diagnoses Final diagnoses:  Headache disorder   Patient left against medical advice with tests were pending  Rx / DC Orders ED Discharge Orders     None         Mackenzy Grumbine,  MD 09/10/23 6045
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
## 2023-09-12 ENCOUNTER — Ambulatory Visit (INDEPENDENT_AMBULATORY_CARE_PROVIDER_SITE_OTHER): Payer: MEDICAID | Admitting: Physician Assistant

## 2023-09-12 ENCOUNTER — Other Ambulatory Visit (HOSPITAL_BASED_OUTPATIENT_CLINIC_OR_DEPARTMENT_OTHER): Payer: Self-pay

## 2023-09-12 ENCOUNTER — Encounter: Payer: Self-pay | Admitting: Physician Assistant

## 2023-09-12 VITALS — BP 142/100 | HR 62 | Temp 98.6°F | Ht 62.0 in | Wt 176.1 lb

## 2023-09-12 DIAGNOSIS — K31A0 Gastric intestinal metaplasia, unspecified: Secondary | ICD-10-CM

## 2023-09-12 DIAGNOSIS — B353 Tinea pedis: Secondary | ICD-10-CM | POA: Diagnosis not present

## 2023-09-12 DIAGNOSIS — Z1231 Encounter for screening mammogram for malignant neoplasm of breast: Secondary | ICD-10-CM

## 2023-09-12 DIAGNOSIS — K219 Gastro-esophageal reflux disease without esophagitis: Secondary | ICD-10-CM | POA: Insufficient documentation

## 2023-09-12 DIAGNOSIS — Z13220 Encounter for screening for lipoid disorders: Secondary | ICD-10-CM

## 2023-09-12 DIAGNOSIS — R739 Hyperglycemia, unspecified: Secondary | ICD-10-CM

## 2023-09-12 DIAGNOSIS — I10 Essential (primary) hypertension: Secondary | ICD-10-CM | POA: Diagnosis not present

## 2023-09-12 LAB — LIPID PANEL
Cholesterol: 209 mg/dL — ABNORMAL HIGH (ref 0–200)
HDL: 46.6 mg/dL (ref >39.00)
LDL Cholesterol: 143 mg/dL — ABNORMAL HIGH (ref 0–99)
NonHDL: 162.02
Total CHOL/HDL Ratio: 4
Triglycerides: 95 mg/dL (ref 0.0–149.0)
VLDL: 19 mg/dL (ref 0.0–40.0)

## 2023-09-12 LAB — HEMOGLOBIN A1C: Hgb A1c MFr Bld: 5.7 % (ref 4.6–6.5)

## 2023-09-12 MED ORDER — KETOCONAZOLE 2 % EX CREA
1.0000 | TOPICAL_CREAM | Freq: Every day | CUTANEOUS | 3 refills | Status: DC
  Filled 2023-09-12: qty 30, 30d supply, fill #0

## 2023-09-12 MED ORDER — OMEPRAZOLE 40 MG PO CPDR
40.0000 mg | DELAYED_RELEASE_CAPSULE | Freq: Every day | ORAL | 1 refills | Status: DC
  Filled 2023-09-12: qty 90, 90d supply, fill #0
  Filled 2023-12-16 – 2024-01-08 (×2): qty 90, 90d supply, fill #1

## 2023-09-12 MED ORDER — AMLODIPINE BESYLATE 5 MG PO TABS
5.0000 mg | ORAL_TABLET | Freq: Every day | ORAL | 1 refills | Status: DC
  Filled 2023-09-12: qty 90, 90d supply, fill #0
  Filled 2023-12-16 – 2024-01-08 (×2): qty 90, 90d supply, fill #1

## 2023-09-13 ENCOUNTER — Other Ambulatory Visit: Payer: Self-pay | Admitting: Physician Assistant

## 2023-09-13 DIAGNOSIS — E7800 Pure hypercholesterolemia, unspecified: Secondary | ICD-10-CM

## 2023-09-13 MED ORDER — ROSUVASTATIN CALCIUM 10 MG PO TABS: 10.0000 mg | ORAL_TABLET | Freq: Every day | ORAL | 3 refills | Status: DC

## 2023-10-14 NOTE — Progress Notes (Unsigned)
      Established patient visit   Patient: Deborah Bradford   DOB: 1981/03/31   42 y.o. Female  MRN: [id number] Visit Date: 10/15/2023  Today's healthcare provider: Alfredia Ferguson, PA-C   No chief complaint on file.  Subjective     Hypertension, follow-up  BP Readings from Last 3 Encounters:  09/12/23 (!) 142/100  09/10/23 (!) 152/103  06/29/23 (!) 153/97   Wt Readings from Last 3 Encounters:  09/12/23 176 lb 2 oz (79.9 kg)  09/09/23 177 lb (80.3 kg)  06/29/23 218 lb 14.7 oz (99.3 kg)     She was last seen for hypertension 1 mo ago. Amlodipine 5 mg was started  She reports {excellent/good/fair/poor:19665} compliance with treatment. She {is/is not:9024} having side effects. {document side effects if present:1} She is following a {diet:21022986} diet. She {is/is not:9024} exercising. She {does/does not:200015} smoke.  Use of agents associated with hypertension: {bp agents assoc with hypertension:511::"none"}.   Outside blood pressures are {***enter patient reported home BP readings, or 'not being checked':1}. Symptoms: {Yes/No:20286} chest pain {Yes/No:20286} chest pressure  {Yes/No:20286} palpitations {Yes/No:20286} syncope  {Yes/No:20286} dyspnea {Yes/No:20286} orthopnea  {Yes/No:20286} paroxysmal nocturnal dyspnea {Yes/No:20286} lower extremity edema   Pertinent labs Lab Results  Component Value Date   CHOL 209 (H) 09/12/2023   HDL 46.60 09/12/2023   LDLCALC 143 (H) 09/12/2023   TRIG 95.0 09/12/2023   CHOLHDL 4 09/12/2023   Lab Results  Component Value Date   NA 135 09/09/2023   K 3.6 09/09/2023   CREATININE 0.79 09/09/2023   GFRNONAA >60 09/09/2023   GLUCOSE 103 (H) 09/09/2023     The 10-year ASCVD risk score (Arnett DK, et al., 2019) is: 9.8%  ---------------------------------------------------------------------------------------------------   Medications: Outpatient Medications Prior to Visit  Medication Sig   amLODipine (NORVASC) 5 MG tablet Take 1  tablet (5 mg total) by mouth daily.   ketoconazole (NIZORAL) 2 % cream Apply 1 application topically daily on feet.   omeprazole (PRILOSEC) 40 MG capsule Take 1 capsule (40 mg total) by mouth daily.   rosuvastatin (CRESTOR) 10 MG tablet Take 1 tablet (10 mg total) by mouth daily.   No facility-administered medications prior to visit.    Review of Systems {Insert previous labs (optional):23779} {See past labs  Heme  Chem  Endocrine  Serology  Results Review (optional):1}   Objective    There were no vitals taken for this visit. {Insert last BP/Wt (optional):23777}{See vitals history (optional):1}  Physical Exam  ***  No results found for any visits on 10/15/23.  Assessment & Plan    There are no diagnoses linked to this encounter.  ***  No follow-ups on file.       Alfredia Ferguson, PA-C  [facility] Primary Care at [facility] [phone] (phone) [phone] (fax)  [facility]

## 2023-10-15 ENCOUNTER — Ambulatory Visit (INDEPENDENT_AMBULATORY_CARE_PROVIDER_SITE_OTHER): Payer: MEDICAID | Admitting: Physician Assistant

## 2023-10-15 ENCOUNTER — Encounter: Payer: Self-pay | Admitting: Physician Assistant

## 2023-10-15 ENCOUNTER — Other Ambulatory Visit (HOSPITAL_BASED_OUTPATIENT_CLINIC_OR_DEPARTMENT_OTHER): Payer: Self-pay

## 2023-10-15 VITALS — BP 125/85 | HR 69 | Temp 98.1°F | Ht 62.0 in | Wt 183.4 lb

## 2023-10-15 DIAGNOSIS — I10 Essential (primary) hypertension: Secondary | ICD-10-CM | POA: Diagnosis not present

## 2023-10-15 DIAGNOSIS — L859 Epidermal thickening, unspecified: Secondary | ICD-10-CM | POA: Diagnosis not present

## 2023-10-15 DIAGNOSIS — Z1231 Encounter for screening mammogram for malignant neoplasm of breast: Secondary | ICD-10-CM

## 2023-10-15 MED ORDER — UREA 20 % EX CREA
TOPICAL_CREAM | CUTANEOUS | 1 refills | Status: AC
  Filled 2023-10-15: qty 85, 30d supply, fill #0
  Filled 2023-12-16: qty 170, 30d supply, fill #1
  Filled 2024-01-08: qty 85, 30d supply, fill #1

## 2023-10-15 NOTE — Assessment & Plan Note (Signed)
Now well controlled Managed with amlodipine 5 mg  F/u 6 mo

## 2023-10-16 ENCOUNTER — Other Ambulatory Visit (HOSPITAL_BASED_OUTPATIENT_CLINIC_OR_DEPARTMENT_OTHER): Payer: Self-pay

## 2023-10-24 ENCOUNTER — Encounter: Payer: Self-pay | Admitting: Physician Assistant

## 2023-10-28 NOTE — Progress Notes (Unsigned)
      Established patient visit   Patient: Deborah Bradford   DOB: 11-Sep-1981   42 y.o. Female  MRN: [id number] Visit Date: 10/29/2023  Today's healthcare provider: Alfredia Ferguson, PA-C   No chief complaint on file.  Subjective     ***  Medications: Outpatient Medications Prior to Visit  Medication Sig   amLODipine (NORVASC) 5 MG tablet Take 1 tablet (5 mg total) by mouth daily.   omeprazole (PRILOSEC) 40 MG capsule Take 1 capsule (40 mg total) by mouth daily.   rosuvastatin (CRESTOR) 10 MG tablet Take 1 tablet (10 mg total) by mouth daily.   urea (CARMOL) 20 % cream Use topically on area of concern daily.   No facility-administered medications prior to visit.    Review of Systems {Insert previous labs (optional):23779} {See past labs  Heme  Chem  Endocrine  Serology  Results Review (optional):1}   Objective    There were no vitals taken for this visit. {Insert last BP/Wt (optional):23777}{See vitals history (optional):1}  Physical Exam  ***  No results found for any visits on 10/29/23.  Assessment & Plan    There are no diagnoses linked to this encounter.  ***  No follow-ups on file.       Alfredia Ferguson, PA-C  [facility] Primary Care at [facility] [phone] (phone) [phone] (fax)  [facility]

## 2023-10-29 ENCOUNTER — Other Ambulatory Visit (HOSPITAL_COMMUNITY)
Admission: RE | Admit: 2023-10-29 | Discharge: 2023-10-29 | Disposition: A | Discharge status: Home / Self Care | Payer: MEDICAID | Source: Ambulatory Visit | Attending: Physician Assistant | Admitting: Physician Assistant

## 2023-10-29 ENCOUNTER — Ambulatory Visit (INDEPENDENT_AMBULATORY_CARE_PROVIDER_SITE_OTHER): Payer: MEDICAID | Admitting: Physician Assistant

## 2023-10-29 ENCOUNTER — Other Ambulatory Visit (HOSPITAL_BASED_OUTPATIENT_CLINIC_OR_DEPARTMENT_OTHER): Payer: Self-pay

## 2023-10-29 ENCOUNTER — Encounter: Payer: Self-pay | Admitting: Physician Assistant

## 2023-10-29 VITALS — BP 124/89 | HR 75 | Temp 98.6°F | Ht 62.0 in | Wt 184.2 lb

## 2023-10-29 DIAGNOSIS — N898 Other specified noninflammatory disorders of vagina: Secondary | ICD-10-CM | POA: Diagnosis present

## 2023-10-29 DIAGNOSIS — B379 Candidiasis, unspecified: Secondary | ICD-10-CM

## 2023-10-29 LAB — POCT URINALYSIS DIP (MANUAL ENTRY)
Blood, UA: NEGATIVE
Glucose, UA: NEGATIVE mg/dL
Leukocytes, UA: NEGATIVE
Nitrite, UA: NEGATIVE
Protein Ur, POC: NEGATIVE mg/dL
Spec Grav, UA: 1.025 (ref 1.010–1.025)
Urobilinogen, UA: 0.2 U/dL
pH, UA: 6 (ref 5.0–8.0)

## 2023-10-29 MED ORDER — FLUCONAZOLE 150 MG PO TABS
150.0000 mg | ORAL_TABLET | ORAL | 0 refills | Status: DC
  Filled 2023-10-29: qty 5, 15d supply, fill #0

## 2023-10-30 ENCOUNTER — Telehealth: Payer: Self-pay

## 2023-10-30 ENCOUNTER — Other Ambulatory Visit: Payer: Self-pay | Admitting: Physician Assistant

## 2023-10-30 ENCOUNTER — Encounter: Payer: Self-pay | Admitting: Physician Assistant

## 2023-10-30 DIAGNOSIS — A599 Trichomoniasis, unspecified: Secondary | ICD-10-CM

## 2023-10-30 LAB — CERVICOVAGINAL ANCILLARY ONLY
Bacterial Vaginitis (gardnerella): NEGATIVE
Candida Glabrata: NEGATIVE
Candida Vaginitis: NEGATIVE
Chlamydia: NEGATIVE
Comment: NEGATIVE
Comment: NEGATIVE
Comment: NEGATIVE
Comment: NEGATIVE
Comment: NEGATIVE
Comment: NORMAL
Neisseria Gonorrhea: NEGATIVE
Trichomonas: POSITIVE — AB

## 2023-10-30 MED ORDER — TINIDAZOLE 500 MG PO TABS
2.0000 g | ORAL_TABLET | Freq: Once | ORAL | 0 refills | Status: AC
Start: 2023-10-30 — End: 2023-10-30

## 2023-10-30 NOTE — Telephone Encounter (Signed)
PA initiated via Covermymeds; KEY: [id number]. Awaiting determination.

## 2023-10-31 NOTE — Telephone Encounter (Signed)
Any update on this?

## 2023-11-01 NOTE — Telephone Encounter (Signed)
PA approved.   Your request has been approved Authorization Expiration Date: 10/29/2024

## 2023-11-28 ENCOUNTER — Encounter: Payer: Self-pay | Admitting: Physician Assistant

## 2023-12-16 ENCOUNTER — Other Ambulatory Visit (HOSPITAL_BASED_OUTPATIENT_CLINIC_OR_DEPARTMENT_OTHER): Payer: Self-pay

## 2023-12-16 ENCOUNTER — Other Ambulatory Visit: Payer: Self-pay

## 2023-12-26 ENCOUNTER — Other Ambulatory Visit (HOSPITAL_BASED_OUTPATIENT_CLINIC_OR_DEPARTMENT_OTHER): Payer: Self-pay

## 2024-01-08 ENCOUNTER — Other Ambulatory Visit (HOSPITAL_BASED_OUTPATIENT_CLINIC_OR_DEPARTMENT_OTHER): Payer: Self-pay

## 2024-01-27 ENCOUNTER — Telehealth: Payer: MEDICAID | Admitting: Physician Assistant

## 2024-01-27 DIAGNOSIS — L249 Irritant contact dermatitis, unspecified cause: Secondary | ICD-10-CM | POA: Diagnosis not present

## 2024-01-27 MED ORDER — TRIAMCINOLONE ACETONIDE 0.1 % EX CREA: 1.0000 | TOPICAL_CREAM | Freq: Two times a day (BID) | CUTANEOUS | 1 refills | Status: AC

## 2024-01-27 NOTE — Patient Instructions (Addendum)
  Shelba Flake, thank you for joining Tylene Fantasia Ward, PA-C for today's virtual visit.  While this provider is not your primary care provider (PCP), if your PCP is located in our provider database this encounter information will be shared with them immediately following your visit.   A [facility] MyChart account gives you access to today's visit and all your visits, tests, and labs performed at [facility] " click here if you don't have a [facility] MyChart account or go to [email or web address]  Consent: (Patient) Deborah Bradford provided verbal consent for this virtual visit at the beginning of the encounter.  Current Medications:  Current Outpatient Medications:    triamcinolone cream (KENALOG) 0.1 %, Apply 1 Application topically 2 (two) times daily., Disp: 30 g, Rfl: 1   amLODipine (NORVASC) 5 MG tablet, Take 1 tablet (5 mg total) by mouth daily., Disp: 90 tablet, Rfl: 1   fluconazole (DIFLUCAN) 150 MG tablet, Take 1 tablet (150 mg total) by mouth every 3 (three) days, for a total of 5 doses., Disp: 5 tablet, Rfl: 0   omeprazole (PRILOSEC) 40 MG capsule, Take 1 capsule (40 mg total) by mouth daily., Disp: 90 capsule, Rfl: 1   rosuvastatin (CRESTOR) 10 MG tablet, Take 1 tablet (10 mg total) by mouth daily., Disp: 90 tablet, Rfl: 3   urea (CARMOL) 20 % cream, Use topically on area of concern daily., Disp: 170 g, Rfl: 1   Medications ordered in this encounter:  Meds ordered this encounter  Medications   triamcinolone cream (KENALOG) 0.1 %    Sig: Apply 1 Application topically 2 (two) times daily.    Dispense:  30 g    Refill:  1    Supervising Provider:   Merrilee Jansky [id number]     *If you need refills on other medications prior to your next appointment, please contact your pharmacy*  Follow-Up: Call back or seek an in-person evaluation if the symptoms worsen or if the condition fails to improve as anticipated.  [facility] [phone]  Other  Instructions Apply cream to affected area two times daily.  If no improvement or symptoms become worse recommend in person evaluation with Primary Care Physician or Urgent Care.    If you have been instructed to have an in-person evaluation today at a local Urgent Care facility, please use the link below. It will take you to a list of all of our available [facility], including address, phone number and hours of operation. Please do not delay care.  [facility]  If you or a family member do not have a primary care provider, use the link below to schedule a visit and establish care. When you choose a [facility] primary care physician or advanced practice provider, you gain a long-term partner in health. Find a Primary Care Provider  Learn more about [facility]'s in-office and virtual care options: [place] [facility] - Get Care Now

## 2024-01-27 NOTE — Progress Notes (Signed)
 Virtual Visit Consent   Deborah Bradford, you are scheduled for a virtual visit with a [facility] provider today. Just as with appointments in the office, your consent must be obtained to participate. Your consent will be active for this visit and any virtual visit you may have with one of our providers in the next 365 days. If you have a MyChart account, a copy of this consent can be sent to you electronically.  As this is a virtual visit, video technology does not allow for your provider to perform a traditional examination. This may limit your provider's ability to fully assess your condition. If your provider identifies any concerns that need to be evaluated in person or the need to arrange testing (such as labs, EKG, etc.), we will make arrangements to do so. Although advances in technology are sophisticated, we cannot ensure that it will always work on either your end or our end. If the connection with a video visit is poor, the visit may have to be switched to a telephone visit. With either a video or telephone visit, we are not always able to ensure that we have a secure connection.  By engaging in this virtual visit, you consent to the provision of healthcare and authorize for your insurance to be billed (if applicable) for the services provided during this visit. Depending on your insurance coverage, you may receive a charge related to this service.  I need to obtain your verbal consent now. Are you willing to proceed with your visit today? Deborah Bradford has provided verbal consent on 01/27/2024 for a virtual visit (video or telephone). Deborah Fantasia Ward, PA-C  Date: 01/27/2024 7:47 PM   Virtual Visit via Video Note   I, Deborah Bradford, connected with  Deborah Bradford  ([id number], [birth year]) on 01/27/24 at  7:45 PM EDT by a video-enabled telemedicine application and verified that I am speaking with the correct person using two identifiers.  Location: Patient: Virtual Visit Location Patient:  Home Provider: Virtual Visit Location Provider: Home Office   I discussed the limitations of evaluation and management by telemedicine and the availability of in person appointments. The patient expressed understanding and agreed to proceed.    History of Present Illness: Deborah Bradford is a 43 y.o. who identifies as a female who was assigned female at birth, and is being seen today for an itching rash to the left side of her neck.  She reports she has experienced similar sx in the past, very sensitive skin.  States that she recently used a new perfume in this same area.  Denies burning, blisters, or pain.  Reports triamcinolone has provided relief previousl.  HPI: HPI  Problems:  Patient Active Problem List   Diagnosis Date Noted   Primary hypertension 09/12/2023   Gastroesophageal reflux disease 09/12/2023    Allergies:  Allergies  Allergen Reactions   Bupropion Anxiety   Flagyl [Metronidazole]    Oseltamivir Other (See Comments)   Wound Dressing Adhesive Dermatitis, Itching and Rash   Medications:  Current Outpatient Medications:    triamcinolone cream (KENALOG) 0.1 %, Apply 1 Application topically 2 (two) times daily., Disp: 30 g, Rfl: 1   amLODipine (NORVASC) 5 MG tablet, Take 1 tablet (5 mg total) by mouth daily., Disp: 90 tablet, Rfl: 1   fluconazole (DIFLUCAN) 150 MG tablet, Take 1 tablet (150 mg total) by mouth every 3 (three) days, for a total of 5 doses., Disp: 5 tablet, Rfl: 0   omeprazole (PRILOSEC) 40 MG capsule,  Take 1 capsule (40 mg total) by mouth daily., Disp: 90 capsule, Rfl: 1   rosuvastatin (CRESTOR) 10 MG tablet, Take 1 tablet (10 mg total) by mouth daily., Disp: 90 tablet, Rfl: 3   urea (CARMOL) 20 % cream, Use topically on area of concern daily., Disp: 170 g, Rfl: 1  Observations/Objective: Patient is well-developed, well-nourished in no acute distress.  Resting comfortably  at home.  Head is normocephalic, atraumatic.  No labored breathing.  Speech is clear  and coherent with logical content.  Patient is alert and oriented at baseline.    Assessment and Plan: 1. Irritant contact dermatitis, unspecified trigger (Primary)  Triamcinolone prescribed.  In person evaluation precautions given.   Follow Up Instructions: I discussed the assessment and treatment plan with the patient. The patient was provided an opportunity to ask questions and all were answered. The patient agreed with the plan and demonstrated an understanding of the instructions.  A copy of instructions were sent to the patient via MyChart unless otherwise noted below.     The patient was advised to call back or seek an in-person evaluation if the symptoms worsen or if the condition fails to improve as anticipated.    Deborah Fantasia Ward, PA-C

## 2024-02-04 ENCOUNTER — Ambulatory Visit
Admission: RE | Admit: 2024-02-04 | Discharge: 2024-02-04 | Disposition: A | Discharge status: Home / Self Care | Payer: MEDICAID | Source: Ambulatory Visit | Attending: Family Medicine | Admitting: Family Medicine

## 2024-02-04 DIAGNOSIS — Z202 Contact with and (suspected) exposure to infections with a predominantly sexual mode of transmission: Secondary | ICD-10-CM | POA: Diagnosis present

## 2024-02-04 DIAGNOSIS — N898 Other specified noninflammatory disorders of vagina: Secondary | ICD-10-CM | POA: Diagnosis not present

## 2024-02-04 MED ORDER — FLUCONAZOLE 200 MG PO TABS: ORAL_TABLET | ORAL | 0 refills | Status: DC

## 2024-02-04 MED ORDER — METRONIDAZOLE 0.75 % EX GEL
1.0000 | Freq: Every day | CUTANEOUS | 0 refills | Status: AC
Start: 2024-02-04 — End: 2024-02-09

## 2024-02-04 NOTE — ED Triage Notes (Signed)
 [facility] to [facility] with [facility] sti a few months ago and took treatment. Pt reports she had told her partner and they told her they took care of it and she saw them last weekend and since then she has had vaginal odor itchiness and she is concerned for infection.

## 2024-02-04 NOTE — Discharge Instructions (Addendum)
 Advised patient to take medications as directed to completion.  Encouraged increase daily water intake to 64 ounces per day while taking these medications.  Advised we will follow-up with [facility] swab results once received.

## 2024-02-04 NOTE — ED Provider Notes (Signed)
 [facility]    CSN: [id number] Arrival date & time: 02/04/24  1510      History   Chief Complaint Chief Complaint  Patient presents with   SEXUALLY TRANSMITTED DISEASE    Entered by patient    HPI Deborah Bradford is a 43 y.o. female.   HPI 43 year old female presents with vaginal itching and vaginal discharge.  Patient is also concerned with potential STD.  PMH significant for obesity, PCOS, and HTN.  Past Medical History:  Diagnosis Date   Anemia    Anxiety    GERD (gastroesophageal reflux disease)    Hypertension    PCOS (polycystic ovarian syndrome)     Patient Active Problem List   Diagnosis Date Noted   Primary hypertension 09/12/2023   Gastroesophageal reflux disease 09/12/2023    Past Surgical History:  Procedure Laterality Date   COLONOSCOPY N/A 10/17/2021   Procedure: COLONOSCOPY;  Surgeon: Regis Bill, MD;  Location: [facility]COPY;  Service: Endoscopy;  Laterality: N/A;   ESOPHAGOGASTRODUODENOSCOPY (EGD) WITH PROPOFOL N/A 10/17/2021   Procedure: ESOPHAGOGASTRODUODENOSCOPY (EGD) WITH PROPOFOL;  Surgeon: Regis Bill, MD;  Location: [facility] ENDOSCOPY;  Service: Endoscopy;  Laterality: N/A;   WISDOM TOOTH EXTRACTION      OB History   No obstetric history on file.      Home Medications    Prior to Admission medications   Medication Sig Start Date End Date Taking? Authorizing Provider  fluconazole (DIFLUCAN) 200 MG tablet Take 1 tab p.o. now, may repeat 1 tab p.o. in 3 days if symptoms are not resolved. 02/04/24  Yes Trevor Iha, FNP  metroNIDAZOLE (METROGEL) 0.75 % gel Apply 1 Application topically daily for 5 days. 02/04/24 02/09/24 Yes Trevor Iha, FNP  amLODipine (NORVASC) 5 MG tablet Take 1 tablet (5 mg total) by mouth daily. 09/12/23   Alfredia Ferguson, PA-C  omeprazole (PRILOSEC) 40 MG capsule Take 1 capsule (40 mg total) by mouth daily. 09/12/23   Alfredia Ferguson, PA-C  rosuvastatin (CRESTOR) 10 MG tablet Take 1 tablet  (10 mg total) by mouth daily. 09/13/23   Alfredia Ferguson, PA-C  triamcinolone cream (KENALOG) 0.1 % Apply 1 Application topically 2 (two) times daily. 01/27/24   Ward, Tylene Fantasia, PA-C  urea (CARMOL) 20 % cream Use topically on area of concern daily. 10/15/23   Alfredia Ferguson, PA-C    Family History Family History  Problem Relation Age of Onset   Hypertension Mother    Cancer Father    Hypertension Father    Hypertension Brother    Cancer Paternal Uncle    Hypertension Maternal Grandmother    Hypertension Maternal Grandfather    Hypertension Paternal Grandmother    Hypertension Paternal Grandfather    Cancer Other     Social History Social History   Tobacco Use   Smoking status: Every Day    Current packs/day: 0.50    Average packs/day: 0.5 packs/day for 15.0 years (7.5 ttl pk-yrs)    Types: Cigarettes   Smokeless tobacco: Never   Tobacco comments:    I want to quit  Vaping Use   Vaping status: Never Used  Substance Use Topics   Alcohol use: No   Drug use: Not Currently    Types: Marijuana     Allergies   Bupropion, Flagyl [metronidazole], Oseltamivir, and Wound dressing adhesive   Review of Systems Review of Systems   Physical Exam Triage Vital Signs ED Triage Vitals [02/04/24 1524]  Encounter Vitals Group     BP  Systolic BP Percentile      Diastolic BP Percentile      Pulse      Resp      Temp      Temp src      SpO2      Weight      Height      Head Circumference      Peak Flow      Pain Score 0     Pain Loc      Pain Education      Exclude from Growth Chart    No data found.  Updated Vital Signs LMP 01/09/2024   Visual Acuity Right Eye Distance:   Left Eye Distance:   Bilateral Distance:    Right Eye Near:   Left Eye Near:    Bilateral Near:     Physical Exam Vitals and nursing note reviewed.  Constitutional:      Appearance: Normal appearance. She is normal weight.  HENT:     Head: Normocephalic and atraumatic.      Mouth/Throat:     Mouth: Mucous membranes are moist.     Pharynx: Oropharynx is clear.  Eyes:     Extraocular Movements: Extraocular movements intact.     Conjunctiva/sclera: Conjunctivae normal.     Pupils: Pupils are equal, round, and reactive to light.  Cardiovascular:     Rate and Rhythm: Normal rate and regular rhythm.     Pulses: Normal pulses.     Heart sounds: Normal heart sounds.  Pulmonary:     Effort: Pulmonary effort is normal.     Breath sounds: Normal breath sounds. No wheezing, rhonchi or rales.  Musculoskeletal:        General: Normal range of motion.     Cervical back: Normal range of motion and neck supple.  Skin:    General: Skin is warm and dry.  Neurological:     General: No focal deficit present.     Mental Status: She is alert and oriented to person, place, and time. Mental status is at baseline.  Psychiatric:        Mood and Affect: Mood normal.        Behavior: Behavior normal.      UC Treatments / Results  Labs (all labs ordered are listed, but only abnormal results are displayed) Labs Reviewed  CERVICOVAGINAL ANCILLARY ONLY    EKG   Radiology No results found.  Procedures Procedures (including critical care time)  Medications Ordered in UC Medications - No data to display  Initial Impression / Assessment and Plan / UC Course  I have reviewed the triage vital signs and the nursing notes.  Pertinent labs & imaging results that were available during my care of the patient were reviewed by me and considered in my medical decision making (see chart for details).     MDM: 1.  Vaginal discharge-aptima swab ordered, Rx'd MetroGel 0.75 gel: 1 applicator/per vagina topically daily for 5 days; 2.  Vaginal itching-Rx'd Diflucan 200 mg tablet: Take 1 tablet p.o. now may repeat 1 tablet in 3 days if symptoms are not completely resolved. Advised patient to take medications as directed to completion.  Encouraged increase daily water intake to 64  ounces per day while taking these medications.  Advised we will follow-up with Aptima swab results once received.  Patient discharged home, hemodynamically stable.  Patient discharged home, hemodynamically stable. Final Clinical Impressions(s) / UC Diagnoses   Final diagnoses:  Vaginal discharge  Vaginal  itching  Potential exposure to STD     Discharge Instructions      Advised patient to take medications as directed to completion.  Encouraged increase daily water intake to 64 ounces per day while taking these medications.  Advised we will follow-up with Aptima swab results once received.     ED Prescriptions     Medication Sig Dispense Auth. Provider   fluconazole (DIFLUCAN) 200 MG tablet Take 1 tab p.o. now, may repeat 1 tab p.o. in 3 days if symptoms are not resolved. 7 tablet Trevor Iha, FNP   metroNIDAZOLE (METROGEL) 0.75 % gel Apply 1 Application topically daily for 5 days. 45 g Trevor Iha, FNP      PDMP not reviewed this encounter.   Trevor Iha, FNP 02/04/24 [phone]

## 2024-02-05 LAB — CERVICOVAGINAL ANCILLARY ONLY
Bacterial Vaginitis (gardnerella): NEGATIVE
Candida Glabrata: NEGATIVE
Candida Vaginitis: NEGATIVE
Chlamydia: NEGATIVE
Comment: NEGATIVE
Comment: NEGATIVE
Comment: NEGATIVE
Comment: NEGATIVE
Comment: NEGATIVE
Comment: NORMAL
Neisseria Gonorrhea: NEGATIVE
Trichomonas: NEGATIVE

## 2024-05-16 ENCOUNTER — Other Ambulatory Visit: Payer: Self-pay | Admitting: Physician Assistant

## 2024-05-16 DIAGNOSIS — K219 Gastro-esophageal reflux disease without esophagitis: Secondary | ICD-10-CM

## 2024-05-18 ENCOUNTER — Other Ambulatory Visit (HOSPITAL_BASED_OUTPATIENT_CLINIC_OR_DEPARTMENT_OTHER): Payer: Self-pay

## 2024-05-18 ENCOUNTER — Other Ambulatory Visit: Payer: Self-pay

## 2024-05-18 MED ORDER — OMEPRAZOLE 40 MG PO CPDR
40.0000 mg | DELAYED_RELEASE_CAPSULE | Freq: Every day | ORAL | 0 refills | Status: AC
  Filled 2024-05-18 – 2024-08-25 (×2): qty 90, 90d supply, fill #0

## 2024-05-18 MED ORDER — AMLODIPINE BESYLATE 5 MG PO TABS
5.0000 mg | ORAL_TABLET | Freq: Every day | ORAL | 0 refills | Status: DC
  Filled 2024-05-18 – 2024-06-03 (×2): qty 90, 90d supply, fill #0

## 2024-05-19 NOTE — Progress Notes (Deleted)
      Established patient visit   Patient: Deborah Bradford   DOB: January 18, 1981   43 y.o. Female  MRN: [id number] Visit Date: 05/20/2024  Today's healthcare provider: Manuelita Flatness, PA-C   No chief complaint on file.  Subjective     ***  Medications: Outpatient Medications Prior to Visit  Medication Sig   amLODipine  (NORVASC ) 5 MG tablet Take 1 tablet (5 mg total) by mouth daily.   fluconazole  (DIFLUCAN ) 200 MG tablet Take 1 tab p.o. now, may repeat 1 tab p.o. in 3 days if symptoms are not resolved.   omeprazole  (PRILOSEC) 40 MG capsule Take 1 capsule (40 mg total) by mouth daily.   rosuvastatin  (CRESTOR ) 10 MG tablet Take 1 tablet (10 mg total) by mouth daily.   triamcinolone  cream (KENALOG ) 0.1 % Apply 1 Application topically 2 (two) times daily.   urea  (CARMOL) 20 % cream Use topically on area of concern daily.   No facility-administered medications prior to visit.    Review of Systems {Insert previous labs (optional):23779} {See past labs  Heme  Chem  Endocrine  Serology  Results Review (optional):1}   Objective    There were no vitals taken for this visit. {Insert last BP/Wt (optional):23777}{See vitals history (optional):1}  Physical Exam  ***  No results found for any visits on 05/20/24.  Assessment & Plan    There are no diagnoses linked to this encounter.  ***  No follow-ups on file.       Manuelita Flatness, PA-C  [facility] [phone] (phone) [phone] (fax)  [facility]

## 2024-05-20 ENCOUNTER — Ambulatory Visit: Payer: MEDICAID | Admitting: Physician Assistant

## 2024-05-20 ENCOUNTER — Other Ambulatory Visit (HOSPITAL_COMMUNITY)
Admission: RE | Admit: 2024-05-20 | Discharge: 2024-05-20 | Disposition: A | Discharge status: Home / Self Care | Payer: MEDICAID | Source: Ambulatory Visit | Attending: Student | Admitting: Student

## 2024-05-20 ENCOUNTER — Other Ambulatory Visit (HOSPITAL_BASED_OUTPATIENT_CLINIC_OR_DEPARTMENT_OTHER): Payer: Self-pay

## 2024-05-20 ENCOUNTER — Ambulatory Visit: Payer: MEDICAID | Admitting: Student

## 2024-05-20 VITALS — BP 118/82 | HR 72 | Ht 62.0 in | Wt 196.6 lb

## 2024-05-20 DIAGNOSIS — Z202 Contact with and (suspected) exposure to infections with a predominantly sexual mode of transmission: Secondary | ICD-10-CM

## 2024-05-20 DIAGNOSIS — Z113 Encounter for screening for infections with a predominantly sexual mode of transmission: Secondary | ICD-10-CM

## 2024-05-20 LAB — POCT URINALYSIS DIPSTICK OB
Bilirubin, UA: NEGATIVE
Blood, UA: NEGATIVE
Glucose, UA: NEGATIVE
Ketones, UA: NEGATIVE
Leukocytes, UA: NEGATIVE
Nitrite, UA: NEGATIVE
POC,PROTEIN,UA: NEGATIVE
Spec Grav, UA: 1.015 (ref 1.010–1.025)
Urobilinogen, UA: 1 U/dL
pH, UA: 7 (ref 5.0–8.0)

## 2024-05-20 MED ORDER — METRONIDAZOLE 0.75 % VA GEL
1.0000 | Freq: Every day | VAGINAL | 0 refills | Status: AC
  Filled 2024-05-20 – 2024-06-03 (×2): qty 70, 7d supply, fill #0

## 2024-05-20 MED ORDER — AZITHROMYCIN 250 MG PO TABS
1000.0000 mg | ORAL_TABLET | Freq: Once | ORAL | 0 refills | Status: AC
  Filled 2024-05-20 – 2024-06-03 (×2): qty 4, 1d supply, fill #0

## 2024-05-20 MED ORDER — CEFTRIAXONE SODIUM 1 G IJ SOLR
1.0000 g | Freq: Once | INTRAMUSCULAR | Status: AC
  Administered 2024-05-20: 1 g via INTRAMUSCULAR

## 2024-05-20 MED ORDER — FLUCONAZOLE 200 MG PO TABS
ORAL_TABLET | ORAL | 0 refills | Status: AC
  Filled 2024-05-20: qty 2, 4d supply, fill #0
  Filled 2024-06-03: qty 2, 3d supply, fill #0

## 2024-05-20 NOTE — Assessment & Plan Note (Signed)
 STD results pending.  Treat prophylactically with Rx-Rocephin 1g IM, Azithromycin 1 g PO, metronidazole  0.75% gel x 5 days.  Return to clinic if symptoms persist or worsen.

## 2024-05-20 NOTE — Progress Notes (Signed)
 Acute Office Visit  Subjective:     Patient ID: Deborah Bradford, female    DOB: 28-Apr-1981, 43 y.o.   MRN: [id number]  Chief Complaint  Patient presents with   Acute Visit    Patient presents today for a STI screening.    HPI Daneya Hartgrove is in today for acute visit. Patient presents for evaluation due to recent unprotected sexual activity with new partner. Denies any known exposure, Partner status is unknown.  Concern for possible STD exposure. Current symptoms +itching, +discharge with foul odor, no color. Denies painful urination, hematuria,  dysuria,  genital lesions, pelvic pain, sore throat, rash. Patient denies fever, chills, SOB, CP, palpitations, dyspnea, edema, HA, N/V/D, abdominal pain, urinary symptoms, rash, weight changes, and recent illness or hospitalizations.     ROS  See HPI    Objective:    BP 118/82   Pulse 72   Ht 5' 2 (1.575 m)   Wt 196 lb 9.6 oz (89.2 kg)   SpO2 98%   BMI 35.96 kg/m    Physical Exam General: No acute distress. Awake and conversant.  Eyes: Normal conjunctiva, anicteric. Round symmetric pupils.  ENT: Hearing grossly intact. No nasal discharge.  Respiratory: CTAB. Respirations are non-labored. No wheezing.  Skin: Warm. No rashes or ulcers.  Psych: Alert and oriented. Cooperative, Appropriate mood and affect, Normal judgment.  CV: RRR. No murmur. No lower extremity edema.  MSK: Normal ambulation. No clubbing or cyanosis.  Neuro:  CN II-XII grossly normal.    Results for orders placed or performed in visit on 05/20/24  POC Urinalysis Dipstick OB  Result Value Ref Range   Color, UA Yellow    Clarity, UA Clear    Glucose, UA Negative Negative   Bilirubin, UA Negative    Ketones, UA Negative    Spec Grav, UA 1.015 1.010 - 1.025   Blood, UA Negative    pH, UA 7.0 5.0 - 8.0   POC,PROTEIN,UA Negative Negative, Trace, Small (1+), Moderate (2+), Large (3+), 4+   Urobilinogen, UA 1.0 0.2 or 1.0 E.U./dL   Nitrite, UA Negative     Leukocytes, UA Negative Negative   Appearance     Odor          Assessment & Plan:   Problem List Items Addressed This Visit     Possible exposure to STD - Primary   STD results pending.  Treat prophylactically with Rx-Rocephin 1g IM, Azithromycin 1 g PO, metronidazole  0.75% gel x 5 days.  Return to clinic if symptoms persist or worsen.      Relevant Medications   cefTRIAXone (ROCEPHIN) injection 1 g   azithromycin (ZITHROMAX) 250 MG tablet   metroNIDAZOLE  (METROGEL ) 0.75 % vaginal gel   Other Relevant Orders   Cervicovaginal ancillary only( [place])   POC Urinalysis Dipstick OB (Completed)   Urine Culture    Meds ordered this encounter  Medications   cefTRIAXone (ROCEPHIN) injection 1 g   azithromycin (ZITHROMAX) 250 MG tablet    Sig: Take 4 tablets (1,000 mg total) by mouth once for 1 dose.    Dispense:  4 tablet    Refill:  0    Supervising Provider:   DOMENICA BLACKBIRD A [[id number]]   metroNIDAZOLE  (METROGEL ) 0.75 % vaginal gel    Sig: Place 1 Applicatorful vaginally at bedtime for 5 days.    Dispense:  70 g    Refill:  0    Supervising Provider:   DOMENICA BLACKBIRD A [[id number]]   fluconazole  (  DIFLUCAN ) 200 MG tablet    Sig: Take 1 tab p.o. now, may repeat 1 tab p.o. in 3 days if symptoms are not resolved.    Dispense:  2 tablet    Refill:  0    Supervising Provider:   DOMENICA BLACKBIRD A [[id number]]    No follow-ups on file.  Deborah Fugett L Yarieliz Wasser, NP

## 2024-05-21 LAB — URINE CULTURE
MICRO NUMBER:: 16652279
Result:: NO GROWTH
SPECIMEN QUALITY:: ADEQUATE

## 2024-05-25 LAB — CERVICOVAGINAL ANCILLARY ONLY
Chlamydia: NEGATIVE
Comment: NEGATIVE
Comment: NEGATIVE
Comment: NORMAL
Neisseria Gonorrhea: NEGATIVE
Trichomonas: NEGATIVE

## 2024-05-26 ENCOUNTER — Ambulatory Visit: Payer: Self-pay | Admitting: Student

## 2024-05-28 ENCOUNTER — Other Ambulatory Visit (HOSPITAL_BASED_OUTPATIENT_CLINIC_OR_DEPARTMENT_OTHER): Payer: Self-pay

## 2024-06-01 ENCOUNTER — Other Ambulatory Visit (HOSPITAL_BASED_OUTPATIENT_CLINIC_OR_DEPARTMENT_OTHER): Payer: Self-pay

## 2024-06-03 ENCOUNTER — Other Ambulatory Visit (HOSPITAL_BASED_OUTPATIENT_CLINIC_OR_DEPARTMENT_OTHER): Payer: Self-pay

## 2024-08-25 ENCOUNTER — Other Ambulatory Visit: Payer: Self-pay

## 2024-08-25 ENCOUNTER — Other Ambulatory Visit (HOSPITAL_BASED_OUTPATIENT_CLINIC_OR_DEPARTMENT_OTHER): Payer: Self-pay

## 2024-10-13 ENCOUNTER — Other Ambulatory Visit (HOSPITAL_BASED_OUTPATIENT_CLINIC_OR_DEPARTMENT_OTHER): Payer: Self-pay

## 2024-11-07 ENCOUNTER — Other Ambulatory Visit: Payer: Self-pay

## 2024-11-13 ENCOUNTER — Telehealth: Payer: Self-pay | Admitting: Physician Assistant

## 2024-11-13 ENCOUNTER — Other Ambulatory Visit (HOSPITAL_BASED_OUTPATIENT_CLINIC_OR_DEPARTMENT_OTHER): Payer: Self-pay

## 2024-11-13 DIAGNOSIS — E7800 Pure hypercholesterolemia, unspecified: Secondary | ICD-10-CM

## 2024-11-13 MED ORDER — ROSUVASTATIN CALCIUM 10 MG PO TABS
10.0000 mg | ORAL_TABLET | Freq: Every day | ORAL | 1 refills | Status: AC
  Filled 2024-11-13: qty 90, 90d supply, fill #0

## 2024-11-13 MED ORDER — AMLODIPINE BESYLATE 5 MG PO TABS
5.0000 mg | ORAL_TABLET | Freq: Every day | ORAL | 1 refills | Status: AC
  Filled 2024-11-13: qty 90, 90d supply, fill #0

## 2024-11-13 NOTE — Telephone Encounter (Signed)
 Copied from CRM #[id number]. Topic: Clinical - Medication Refill >> Nov 13, 2024  3:04 PM Harlene ORN wrote: Medication: amLODipine  (NORVASC ) 5 MG tablet, rosuvastatin  (CRESTOR ) 10 MG tablet  Has the patient contacted their pharmacy? Yes (Agent: If no, request that the patient contact the pharmacy for the refill. If patient does not wish to contact the pharmacy document the reason why and proceed with request.) (Agent: If yes, when and what did the pharmacy advise?)  This is the patient's preferred pharmacy:  [facility] [place] [facility] [address], [address] [place] [address] Phone: [phone] Fax: [phone]  Is this the correct pharmacy for this prescription? Yes If no, delete pharmacy and type the correct one.   Has the prescription been filled recently? No  Is the patient out of the medication? Yes  Has the patient been seen for an appointment in the last year OR does the patient have an upcoming appointment? Yes  Can we respond through MyChart? Yes  Agent: Please be advised that Rx refills may take up to 3 business days. We ask that you follow-up with your pharmacy.

## 2024-11-13 NOTE — Telephone Encounter (Signed)
 Pt notified and TOC appt made for 02/25/25.  Rxs refilled.

## 2024-12-03 ENCOUNTER — Other Ambulatory Visit (HOSPITAL_COMMUNITY): Payer: Self-pay

## 2025-02-25 ENCOUNTER — Encounter: Payer: Self-pay | Admitting: Student
